# Patient Record
Sex: Female | Born: 1975 | Race: Black or African American | Hispanic: No | Marital: Married | Smoking: Current every day smoker
Health system: Southern US, Community
[De-identification: ages and names within clinical notes are randomized; demographics above are authoritative.]

## PROBLEM LIST (undated history)

## (undated) DIAGNOSIS — I10 Essential (primary) hypertension: Secondary | ICD-10-CM

## (undated) HISTORY — PX: OTHER SURGICAL HISTORY: SHX169

## (undated) HISTORY — PX: CHOLECYSTECTOMY: SHX55

---

## 2002-05-06 ENCOUNTER — Emergency Department (HOSPITAL_COMMUNITY): Admission: EM | Admit: 2002-05-06 | Discharge: 2002-05-06 | Payer: Self-pay

## 2002-05-09 ENCOUNTER — Emergency Department (HOSPITAL_COMMUNITY): Admission: EM | Admit: 2002-05-09 | Discharge: 2002-05-09 | Payer: Self-pay | Admitting: Emergency Medicine

## 2002-05-12 ENCOUNTER — Emergency Department (HOSPITAL_COMMUNITY): Admission: EM | Admit: 2002-05-12 | Discharge: 2002-05-12 | Payer: Self-pay | Admitting: Emergency Medicine

## 2002-07-23 ENCOUNTER — Emergency Department (HOSPITAL_COMMUNITY): Admission: EM | Admit: 2002-07-23 | Discharge: 2002-07-23 | Payer: Self-pay | Admitting: Emergency Medicine

## 2003-04-16 ENCOUNTER — Encounter: Payer: Self-pay | Admitting: Emergency Medicine

## 2003-04-16 ENCOUNTER — Emergency Department (HOSPITAL_COMMUNITY): Admission: EM | Admit: 2003-04-16 | Discharge: 2003-04-16 | Payer: Self-pay | Admitting: Emergency Medicine

## 2003-04-22 ENCOUNTER — Encounter: Payer: Self-pay | Admitting: Family Medicine

## 2003-04-22 ENCOUNTER — Inpatient Hospital Stay (HOSPITAL_COMMUNITY): Admission: AD | Admit: 2003-04-22 | Discharge: 2003-04-22 | Payer: Self-pay | Admitting: Obstetrics & Gynecology

## 2003-05-06 ENCOUNTER — Ambulatory Visit (HOSPITAL_COMMUNITY): Admission: RE | Admit: 2003-05-06 | Discharge: 2003-05-06 | Payer: Self-pay | Admitting: Family Medicine

## 2003-05-06 ENCOUNTER — Encounter: Payer: Self-pay | Admitting: Family Medicine

## 2003-12-29 ENCOUNTER — Encounter (INDEPENDENT_AMBULATORY_CARE_PROVIDER_SITE_OTHER): Payer: Self-pay | Admitting: Specialist

## 2003-12-29 ENCOUNTER — Inpatient Hospital Stay (HOSPITAL_COMMUNITY): Admission: AD | Admit: 2003-12-29 | Discharge: 2004-01-01 | Payer: Self-pay | Admitting: Obstetrics and Gynecology

## 2004-05-28 ENCOUNTER — Emergency Department (HOSPITAL_COMMUNITY): Admission: EM | Admit: 2004-05-28 | Discharge: 2004-05-28 | Payer: Self-pay | Admitting: Family Medicine

## 2004-05-30 ENCOUNTER — Emergency Department (HOSPITAL_COMMUNITY): Admission: EM | Admit: 2004-05-30 | Discharge: 2004-05-30 | Payer: Self-pay | Admitting: Emergency Medicine

## 2004-06-13 ENCOUNTER — Emergency Department (HOSPITAL_COMMUNITY): Admission: EM | Admit: 2004-06-13 | Discharge: 2004-06-13 | Payer: Self-pay | Admitting: Family Medicine

## 2004-09-12 ENCOUNTER — Emergency Department (HOSPITAL_COMMUNITY): Admission: EM | Admit: 2004-09-12 | Discharge: 2004-09-12 | Payer: Self-pay | Admitting: Family Medicine

## 2004-10-22 ENCOUNTER — Ambulatory Visit (HOSPITAL_BASED_OUTPATIENT_CLINIC_OR_DEPARTMENT_OTHER): Admission: RE | Admit: 2004-10-22 | Discharge: 2004-10-22 | Payer: Self-pay | Admitting: General Surgery

## 2004-10-22 ENCOUNTER — Encounter (INDEPENDENT_AMBULATORY_CARE_PROVIDER_SITE_OTHER): Payer: Self-pay | Admitting: Specialist

## 2004-10-22 ENCOUNTER — Ambulatory Visit (HOSPITAL_COMMUNITY): Admission: RE | Admit: 2004-10-22 | Discharge: 2004-10-22 | Payer: Self-pay | Admitting: General Surgery

## 2004-12-27 ENCOUNTER — Emergency Department (HOSPITAL_COMMUNITY): Admission: EM | Admit: 2004-12-27 | Discharge: 2004-12-27 | Payer: Self-pay | Admitting: Family Medicine

## 2005-03-21 ENCOUNTER — Emergency Department (HOSPITAL_COMMUNITY): Admission: EM | Admit: 2005-03-21 | Discharge: 2005-03-21 | Payer: Self-pay | Admitting: Emergency Medicine

## 2005-05-27 ENCOUNTER — Ambulatory Visit (HOSPITAL_COMMUNITY): Admission: RE | Admit: 2005-05-27 | Discharge: 2005-05-28 | Payer: Self-pay | Admitting: General Surgery

## 2005-05-27 ENCOUNTER — Encounter (INDEPENDENT_AMBULATORY_CARE_PROVIDER_SITE_OTHER): Payer: Self-pay | Admitting: *Deleted

## 2005-08-22 ENCOUNTER — Other Ambulatory Visit: Admission: RE | Admit: 2005-08-22 | Discharge: 2005-08-22 | Payer: Self-pay | Admitting: Obstetrics and Gynecology

## 2005-09-13 ENCOUNTER — Emergency Department (HOSPITAL_COMMUNITY): Admission: EM | Admit: 2005-09-13 | Discharge: 2005-09-13 | Payer: Self-pay | Admitting: Family Medicine

## 2005-09-15 ENCOUNTER — Emergency Department (HOSPITAL_COMMUNITY): Admission: AD | Admit: 2005-09-15 | Discharge: 2005-09-15 | Payer: Self-pay | Admitting: Emergency Medicine

## 2005-10-18 ENCOUNTER — Inpatient Hospital Stay (HOSPITAL_COMMUNITY): Admission: AD | Admit: 2005-10-18 | Discharge: 2005-10-18 | Payer: Self-pay | Admitting: Obstetrics and Gynecology

## 2006-01-14 ENCOUNTER — Encounter: Admission: RE | Admit: 2006-01-14 | Discharge: 2006-02-18 | Payer: Self-pay | Admitting: Obstetrics and Gynecology

## 2006-03-07 ENCOUNTER — Inpatient Hospital Stay (HOSPITAL_COMMUNITY): Admission: RE | Admit: 2006-03-07 | Discharge: 2006-03-10 | Payer: Self-pay | Admitting: Obstetrics and Gynecology

## 2006-03-07 ENCOUNTER — Encounter (INDEPENDENT_AMBULATORY_CARE_PROVIDER_SITE_OTHER): Payer: Self-pay | Admitting: Specialist

## 2006-06-06 ENCOUNTER — Emergency Department (HOSPITAL_COMMUNITY): Admission: EM | Admit: 2006-06-06 | Discharge: 2006-06-06 | Payer: Self-pay | Admitting: Family Medicine

## 2006-06-07 ENCOUNTER — Emergency Department (HOSPITAL_COMMUNITY): Admission: EM | Admit: 2006-06-07 | Discharge: 2006-06-07 | Payer: Self-pay | Admitting: Family Medicine

## 2006-08-10 ENCOUNTER — Emergency Department (HOSPITAL_COMMUNITY): Admission: EM | Admit: 2006-08-10 | Discharge: 2006-08-10 | Payer: Self-pay | Admitting: Family Medicine

## 2006-08-15 ENCOUNTER — Emergency Department (HOSPITAL_COMMUNITY): Admission: EM | Admit: 2006-08-15 | Discharge: 2006-08-16 | Payer: Self-pay | Admitting: Emergency Medicine

## 2007-01-11 ENCOUNTER — Emergency Department (HOSPITAL_COMMUNITY): Admission: EM | Admit: 2007-01-11 | Discharge: 2007-01-11 | Payer: Self-pay | Admitting: Emergency Medicine

## 2007-07-17 ENCOUNTER — Emergency Department (HOSPITAL_COMMUNITY): Admission: EM | Admit: 2007-07-17 | Discharge: 2007-07-17 | Payer: Self-pay | Admitting: Family Medicine

## 2007-08-20 ENCOUNTER — Emergency Department (HOSPITAL_COMMUNITY): Admission: EM | Admit: 2007-08-20 | Discharge: 2007-08-20 | Payer: Self-pay | Admitting: Emergency Medicine

## 2008-01-17 ENCOUNTER — Emergency Department (HOSPITAL_COMMUNITY): Admission: EM | Admit: 2008-01-17 | Discharge: 2008-01-17 | Payer: Self-pay | Admitting: Family Medicine

## 2008-02-12 ENCOUNTER — Emergency Department (HOSPITAL_COMMUNITY): Admission: EM | Admit: 2008-02-12 | Discharge: 2008-02-12 | Payer: Self-pay | Admitting: Neurosurgery

## 2008-08-02 ENCOUNTER — Emergency Department (HOSPITAL_COMMUNITY): Admission: EM | Admit: 2008-08-02 | Discharge: 2008-08-02 | Payer: Self-pay | Admitting: Family Medicine

## 2009-01-06 ENCOUNTER — Emergency Department (HOSPITAL_COMMUNITY): Admission: EM | Admit: 2009-01-06 | Discharge: 2009-01-06 | Payer: Self-pay | Admitting: Family Medicine

## 2009-01-11 ENCOUNTER — Emergency Department (HOSPITAL_COMMUNITY): Admission: EM | Admit: 2009-01-11 | Discharge: 2009-01-11 | Payer: Self-pay | Admitting: Emergency Medicine

## 2010-01-04 ENCOUNTER — Emergency Department (HOSPITAL_COMMUNITY): Admission: EM | Admit: 2010-01-04 | Discharge: 2010-01-04 | Payer: Self-pay | Admitting: Family Medicine

## 2010-04-16 ENCOUNTER — Emergency Department (HOSPITAL_COMMUNITY): Admission: EM | Admit: 2010-04-16 | Discharge: 2010-04-16 | Payer: Self-pay | Admitting: Pediatrics

## 2010-09-30 ENCOUNTER — Encounter: Payer: Self-pay | Admitting: Internal Medicine

## 2010-11-06 ENCOUNTER — Emergency Department (HOSPITAL_COMMUNITY)
Admission: EM | Admit: 2010-11-06 | Discharge: 2010-11-06 | Disposition: A | Discharge status: Home / Self Care | Payer: Self-pay | Attending: Emergency Medicine | Admitting: Emergency Medicine

## 2010-11-06 DIAGNOSIS — L732 Hidradenitis suppurativa: Secondary | ICD-10-CM | POA: Insufficient documentation

## 2010-11-06 DIAGNOSIS — IMO0002 Reserved for concepts with insufficient information to code with codable children: Secondary | ICD-10-CM | POA: Insufficient documentation

## 2010-11-23 LAB — CULTURE, ROUTINE-ABSCESS

## 2010-11-27 LAB — POCT URINALYSIS DIP (DEVICE)
Protein, ur: 100 mg/dL — AB
Specific Gravity, Urine: 1.025 (ref 1.005–1.030)
Urobilinogen, UA: 0.2 mg/dL (ref 0.0–1.0)
pH: 5.5 (ref 5.0–8.0)

## 2010-11-27 LAB — POCT I-STAT, CHEM 8
BUN: 7 mg/dL (ref 6–23)
Calcium, Ion: 1.15 mmol/L (ref 1.12–1.32)
Chloride: 108 mEq/L (ref 96–112)
Creatinine, Ser: 0.4 mg/dL (ref 0.4–1.2)
Glucose, Bld: 98 mg/dL (ref 70–99)
TCO2: 23 mmol/L (ref 0–100)

## 2010-11-27 LAB — POCT PREGNANCY, URINE: Preg Test, Ur: NEGATIVE

## 2010-12-19 LAB — POCT RAPID STREP A (OFFICE): Streptococcus, Group A Screen (Direct): NEGATIVE

## 2011-01-03 ENCOUNTER — Inpatient Hospital Stay (INDEPENDENT_AMBULATORY_CARE_PROVIDER_SITE_OTHER)
Admission: RE | Admit: 2011-01-03 | Discharge: 2011-01-03 | Disposition: A | Discharge status: Home / Self Care | Payer: Self-pay | Source: Ambulatory Visit | Attending: Family Medicine | Admitting: Family Medicine

## 2011-01-03 DIAGNOSIS — F411 Generalized anxiety disorder: Secondary | ICD-10-CM

## 2011-01-03 DIAGNOSIS — I10 Essential (primary) hypertension: Secondary | ICD-10-CM

## 2011-01-03 LAB — POCT I-STAT, CHEM 8
BUN: 7 mg/dL (ref 6–23)
Calcium, Ion: 1.19 mmol/L (ref 1.12–1.32)
Chloride: 104 mEq/L (ref 96–112)
Creatinine, Ser: 0.8 mg/dL (ref 0.4–1.2)
Glucose, Bld: 92 mg/dL (ref 70–99)
TCO2: 27 mmol/L (ref 0–100)

## 2011-01-25 NOTE — H&P (Signed)
NAME:  Mindy [name], Mindy [name] NO.:  [id number]   MEDICAL RECORD NO.:  [id number]          PATIENT TYPE:  INP   LOCATION:  NA                            FACILITY:  [facility]   PHYSICIAN:  Osborn Coho, M.D.   DATE OF BIRTH:  03-May-1976   DATE OF ADMISSION:  DATE OF DISCHARGE:                                HISTORY & PHYSICAL   This is a 35 year old gravida 5, para 2-0-   Dictation ended at this point.      Marie L. Williams, C.N.M.      Osborn Coho, M.D.  Electronically Signed    MLW/MEDQ  D:  03/07/2006  T:  03/07/2006  Job:  [id number]

## 2011-01-25 NOTE — Op Note (Signed)
Mindy [name], [name]:  [id number]   MEDICAL RECORD NO.:  [id number]          PATIENT TYPE:  OIB   LOCATION:  5731                         FACILITY:  [facility]   PHYSICIAN:  Cherylynn Ridges, M.D.    DATE OF BIRTH:  1976-08-21   DATE OF PROCEDURE:  05/27/2005  DATE OF DISCHARGE:                                 OPERATIVE REPORT   PREOPERATIVE DIAGNOSIS:  Symptomatic cholelithiasis with normal liver  function tests.   POSTOPERATIVE DIAGNOSIS:  Symptomatic cholelithiasis with normal liver  function tests.   PROCEDURE:  Laparoscopic cholecystectomy with cholangiogram.   SURGEON:  Cherylynn Ridges, M.D.   ASSISTANT:  Anselm Pancoast. Zachery Dakins, M.D.   ANESTHESIA:  General endotracheal.   ESTIMATED BLOOD LOSS:  Less than 20 mL.   COMPLICATIONS:  None.   CONDITION:  Stable.   SPECIMEN:  Gallbladder plus stones.   INDICATIONS FOR OPERATION:  The patient is a 35 year old with abdominal pain  in her right upper quadrant, who now comes in for an elective laparoscopic  cholecystectomy.   FINDINGS:  The patient had adhesions to the dome of the gallbladder  indicative of previous inflammation.  She also had a bilobed sort of  gallbladder which folded onto itself into the gallbladder bed.  Pictures of  this were taken.   OPERATION:  The patient was taken to the operating room and placed on the  table in supine position.  After an adequate endotracheal anesthetic was  administered, she was prepped and draped in the usual sterile manner,  exposing the midline and the right upper quadrant.   A supraumbilical curvilinear incision was made using a #11 blade and taken  down to the midline fascia.  It was through this midline fascia that a  Veress needle was passed into the peritoneal cavity and confirmed to be in  position using the saline test.  Once it was confirmed to be in good  position, carbon dioxide gas was insufflated through the Veress needle into  the peritoneal  cavity up to maximal intra-abdominal pressure of 50 mmHg.   Once this was completed, 2 right costal margin 5-mm cannulas and a  subxiphoid 11/12-mm cannula were passed under direct vision into the  peritoneal cavity.  The patient was placed in a steep reversed  Trendelenburg, the left side was tilted down and we began the dissection of  the gallbladder.   As mentioned previously, there were adhesions of omentum and also the  duodenum to the body and infundibulum of the gallbladder.  These were  carefully taken away using blunt and sharp dissection with electrocautery.  We were able to expose the infundibulum and subsequently the peritoneum  overlying the triangle of Calot and hepatoduodenal triangle.  We dissected  out windows around both cystic duct and the cystic artery.  The cystic  artery was clipped doubly on its distal side and proximally along the  gallbladder side x1 and transected.  We subsequently clipped the cystic duct  along the gallbladder side and performed a choledochotomy through which a  Cook catheter was passed which had been passed through  the anterior  abdominal wall.  The cholangiogram was done, which showed good flow into the  duodenum, no intraductal filling defects, no biliary dilatation and good  proximal flow.  There was also good flow into the duodenum.  Once this was  completed, we removed the [facility] catheter after removing its clip, which had  secured it in place, endoclipped the cystic duct distally x2 and then  transected the cystic duct.  We then dissected out the gallbladder from its  bed with minimal difficulty , although it was bilobed and showed possibility  of having 2 different gallbladders; it was just folded on itself.  All  needle counts and sponge counts were correct.  Once we had the gallbladder  dissected out, we passed it into an EndoCatch bag and brought it out through  the supraumbilical fascia.  We closed the fascia using a figure-of-eight   stitch of 0 Vicryl.  We injected Marcaine 0.25% with epinephrine at all  sites and once all gas and instrument had been removed, we closed fascia and  closed the skin using a running subcuticular stitch of 4-0 Vicryl.  Sterile  dressings were applied.  All needle counts, sponge counts and instrument  counts were correct.      Cherylynn Ridges, M.D.  Electronically Signed     JOW/MEDQ  D:  05/27/2005  T:  05/28/2005  Job:  [id number]   cc:   Dr. Cindee Lame

## 2011-01-25 NOTE — Op Note (Signed)
NAMEJANNELLY, BERGREN NO.:  [id number]   MEDICAL RECORD NO.:  [id number]                   PATIENT TYPE:  INP   LOCATION:  9101                                 [facility]ACILITY[facility]  [facility]   PHYSICIAN:  Osborn Coho, M.D.                DATE OF BIRTH:  [birth year]   DATE OF PROCEDURE:  12/29/2003  DATE OF DISCHARGE:                                 OPERATIVE REPORT   PREOPERATIVE DIAGNOSIS:  1. Term intrauterine pregnancy.  2. IUGR.  3. Oligohydramnios.  4. Repeat cesarean section.   POSTOPERATIVE DIAGNOSIS:  1. Term intrauterine pregnancy.  2. IUGR.  3. Oligohydramnios.  4. Repeat cesarean section.   PROCEDURE:  Repeat low transverse cesarean section via Pfannenstiel skin  incision.   ANESTHESIA:  Spinal.   ATTENDING PHYSICIAN:  Osborn Coho, M.D.   FLUIDS REPLACED:  2000 mL.   URINE OUTPUT:  125 mL.   ESTIMATED BLOOD LOSS:  400 mL.   FINDINGS:  Live female infant with Apgars of 9 at one minute and 9 at five  minutes.  Normal to palpation bilateral ovaries and tubes.   COMPLICATIONS:  None.   PATHOLOGY:  Placenta.   DESCRIPTION OF PROCEDURE:  The patient was taken to the operating room after  the risks, benefits and alternatives were discussed with the patient, the  patient verbalized understanding and consent signed and witness.  The  patient was given a spinal per anesthesia and prepped and draped in the  normal sterile fashion.  A Pfannenstiel skin incision was made at the site  of the prior scar and carried down to the underlying layer of fascia with  the knife and Bovie.  The fascia was excised bilaterally in the midline with  the Bovie and extended bilaterally with the Mayo scissors.  Kocher clamps  were placed on the inferior aspect of the fascial incision and the rectus  muscle excised from the fascia with the scissors and Bovie.  The same was  done on the superior aspect of the fascial incision.  The muscle was  separated in the  midline and scarring noted of the bladder to the peritoneum  to the lower portion of the rectus muscle.  Adhesions were removed and the  peritoneum was entered bluntly and peritoneum retracted manually.  The  bladder blade was placed and bladder flap created with Metzenbaum scissors.  The uterine incision was made and extended bilaterally with the bandage  scissors.  No fluid return after rupture of membranes and thick meconium  noted.  The infant was in the vertex presentation and upon delivery of the  head, the oropharynx and nasopharynx were DeLee suctioned.  The remainder of  the infant was delivered and handed off to the awaiting pediatricians after  the cord was clamped and cut.  Cord bloods were collected.  The placenta was  sent to pathology.  The uterus was cleared of all clots  and debris after the  placenta was removed via fundal massage.  A sponge stick was used to dilate  the cervix.  The uterine incision  was repaired with 0 Vicryl in a running  locked fashion.  A second imbricating layer was performed.  The bilateral  ovaries and fallopian tubes felt normal to palpation.  The intra-abdominal  cavity was copiously irrigated and the gutters were cleared of all clots and  debris.  The peritoneum was closed with 3-0 chromic.  The fascia was  repaired with 0 Vicryl in a running fashion.  The subcutaneous tissue was  irrigated and made hemostatic with the Bovie and a subcuticular stitch was  placed to reapproximate the skin using 3-0 Monocryl.  Sponge, lap and needle  counts were correct.  The patient tolerated the procedure well and was  returned to the recovery room in stable condition.                                               Osborn Coho, M.D.    AR/MEDQ  D:  12/29/2003  T:  12/30/2003  Job:  [id number]

## 2011-01-25 NOTE — Discharge Summary (Signed)
NAMECAYLAH, Mindy [name]                          ACCOUNT NO.:  [id number]   MEDICAL RECORD NO.:  [id number]                   PATIENT TYPE:  INP   LOCATION:  9101                                 [facility]ACILITY[facility]   PHYSICIAN:  Hal Morales, M.D.             DATE OF BIRTH:  1976-03-07   DATE OF ADMISSION:  12/29/2003  DATE OF DISCHARGE:  01/01/2004                                 DISCHARGE SUMMARY   ADMISSION DIAGNOSES:  1. Intrauterine pregnancy at 41-2/7 weeks post dates.  2. Intrauterine growth restriction.  3. Oligohydramnios.  4. Previous Cesarean section.   PROCEDURE:  Repeat Cesarean section with Pfannenstiel.   DISCHARGE DIAGNOSES:  1. Intrauterine pregnancy at 41-2/7 weeks post dates.  2. Intrauterine growth restriction.  3. Oligohydramnios.  4. Previous Cesarean section.   [name] is a 35 year old, gravida 4, para 1-0-2-1, who presents at 41-2/7  weeks from the office following ultrasound showing IUGR and oligohydramnios.  She had a previous Cesarean section and options were discussed with her by  Dr. Osborn Coho regarding induction of labor with the use of Pitocin and  repeat C-section. The risks and benefits of both and the risks and benefits  of VBAC were discussed with the patient and decision was made for repeat  Cesarean section that was performed by Dr. Osborn Coho on December 29, 2003,  with the birth of a 6 pound 6 ounce female infant with Apgar scores of 9 at  1 minute and 9 at 5 minutes. The patient and baby have both done well in the  postoperative period. The patient's vital signs have all remained stable on  the first postoperative day. Her hemoglobin was 10.8.  Her incision remained  clean, dry, and intact. On the third postoperative day she was judged to be  in satisfactory condition for discharge.   DISCHARGE INSTRUCTIONS:  Per [facility] handout.   DISCHARGE MEDICATIONS:  Motrin 600 mg p.o. q.6h. p.r.n. pain, Tylox one to  two p.o.  q.3-4h. p.r.n. pain, Micronor for birth control, and prenatal  vitamins.   The patient will follow up at the office of [facility] in six weeks.     Rica Koyanagi, C.N.M.               Hal Morales, M.D.    SDM/MEDQ  D:  01/01/2004  T:  01/01/2004  Job:  [id number]
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
## 2011-01-25 NOTE — Discharge Summary (Signed)
Mindy [name], CARTAYA NO.:  [id number]   MEDICAL RECORD NO.:  [id number]          PATIENT TYPE:  INP   LOCATION:  9120                          FACILITY:  [facility]   PHYSICIAN:  Osborn Coho, M.D.   DATE OF BIRTH:  [birth year]   DATE OF ADMISSION:  03/07/2006  DATE OF DISCHARGE:  03/10/2006                                 DISCHARGE SUMMARY   ADMITTING DIAGNOSES:  1.  Intrauterine pregnancy at term.  2.  Elective repeat cesarean section.  3.  Desires permanent sterilization.   DISCHARGE DIAGNOSES:  1.  Intrauterine pregnancy at term.  2.  Elective repeat cesarean section.  3.  Desires permanent sterilization.   PROCEDURES:  1.  Repeat low-transverse cesarean section.  2.  Bilateral tubal ligation.  3.  Spinal anesthesia.   HOSPITAL COURSE:  Ms. Esquer is a 35 year old, gravida 5, para 2-0-2-2 who  presented at 29 and 3/7 weeks for a repeat elective cesarean section.  Her  history had been remarkable for:  1.  Previous C-section x2 with a desire for repeat.  2.  Desires tubal sterilization.  3.  Unsure dates.  4.  First trimester bleeding.  5.  Sickle cell trait.  6.  History of depression.  7.  History of oligohydramnios with previous pregnancy.  8.  Smoker.  9.  Latex sensitivity.  10. Gestational diabetes on insulin.  11. Polyhydramnios.   The patient was taken to the operating room on March 07, 2006 where a repeat  low-transfer cesarean section was performed by Dr. Osborn Coho, along  with a tubal sterilization under spinal anesthesia.  Findings were a viable  female, weight 6 pounds 7 ounces, Apgars were 9 and 9.  Infant was taken to  the full-term nursery and mother was taken to recovery in good condition.  By postop day 1, the patient was doing well.  She was up ad lib and she was  tolerating a regular diet.  She was breastfeeding.  Her hemoglobin was 9.9,  down from 11.6.  Platelets were 197.  Her white blood cell count was 80.  Her fasting blood  sugar on the morning of postop day 1 was 94.  The rest of  her hospital stay was essentially uncomplicated.  By postop day 3, she was  again continuing to do well.  Vital signs were stable.  Her incision was  clean, dry and intact with subcuticular sutures noted.  She was deemed to  have received the full benefit of her hospital stay and was discharged home.   DISCHARGE INSTRUCTIONS:  Per [facility] handout.   DISCHARGE MEDICATIONS:  1.  Motrin 600 mg p.o. q.6h. p.r.n. pain.  2.  Tylox 1-2 p.o. q.3-4h. p.r.n. pain.   DISCHARGE FOLLOWUP:  Will occur in 6 weeks at [facility].      Renaldo Reel Emilee Hero, C.N.M.      Osborn Coho, M.D.  Electronically Signed    VLL/MEDQ  D:  03/10/2006  T:  03/10/2006  Job:  [id number]

## 2011-01-25 NOTE — H&P (Signed)
NAME:  Mindy [name], Mindy [name] NO.:  [id number]   MEDICAL RECORD NO.:  [id number]                   PATIENT TYPE:  MAT   LOCATION:  MATC                                 [facility]   PHYSICIAN:  Osborn Coho, M.D.                DATE OF BIRTH:  06/27/1976   DATE OF ADMISSION:  12/29/2003  DATE OF DISCHARGE:                                HISTORY & PHYSICAL   HISTORY OF PRESENT ILLNESS:  This is a 35 year old gravida 4 para 1-0-2-1 at  21 and two-sevenths weeks who presents from the office with reported IUGR  and oligohydramnios at post term.  She is to be delivered by either a trial  of labor or repeat C-section - to be decided.  She reports mild uterine  contractions, some wetness last night, and positive fetal movement.  She  denies bleeding.  The pregnancy has been followed by the M.D. service and  remarkable for:  1. Unsure dates.  2. Previous C-section.  3. Sickle cell trait.  4. History of depression.  5. Group B strep positive.   PRENATAL LABORATORY DATA:  Blood type is A positive.  Other labs are  currently unavailable on the record.   HISTORY OF CURRENT PREGNANCY:  The patient entered care at [redacted] weeks  gestation.  She had some problems with an abscessed tooth during her  pregnancy.  She also experienced some weight loss due to poor eating habits.  She also had some low back pain which was worked up as preterm labor and the  workup was negative.  She was ambivalent back and forth during the pregnancy  about planning a trial of labor.  At the end of her pregnancy she  experienced a left axillary abscess which was incised and drained on December 14, 2003 with no complications.  She presented to the office today and was  found to have oligohydramnios and IUGR and was therefore sent over to the  hospital for delivery.   OBSTETRICAL HISTORY:  Remarkable for a spontaneous abortion in 1998 at [redacted]  weeks gestation with a D&C.  She had a low transverse  cesarean section in  2000 of a female infant at [redacted] weeks gestation weighing 8 pounds 4 ounces,  remarkable for a nonreassuring fetal heart rate tracing and postoperative  wound dehiscence.  She had an elective abortion in 2001 at [redacted] weeks  gestation with no complications.   MEDICAL HISTORY:  1. Anemia in her second pregnancy.  2. Conception on oral contraceptives this pregnancy.  3. She has a history of abnormal Pap smear in 1996 with normal ever since.  4. History of chlamydia and gonorrhea in 1998 and 2002 respectively.  5. History of Trichomonas in 2002.  6. Childhood varicella.  7. History of depression for which she currently does not take any     medications.  8. History of sickle cell trait.  SURGICAL HISTORY:  Remarkable for a C-section in 2000, a D&C in 1998, and  elective abortion in 2001.   FAMILY HISTORY:  Remarkable for grandmother with heart disease and  hypertension.  Brother with asthma.  A daughter with asthma.  Grandmother  and uncle with diabetes.  Uncle with migraines.  An aunt with breast cancer.   GENETIC HISTORY:  Remarkable for the patient with sickle cell trait.  First  cousin with sickle cell disease.  Maternal relatives with sickle cell trait.  She also has an uncle who has two sets of twins.   SOCIAL HISTORY:  The patient is single but involved with [employer] who  is somewhat involved but present today.  She works as a Magazine features editor.  She is of the [place] faith.  She  denies any alcohol, tobacco, or drug use.   ALLERGIES:  Remarkable for MORPHINE which makes her itch.   CURRENT MEDICATIONS:  Prenatal vitamins and recent antibiotic use.   OBJECTIVE DATA:  VITAL SIGNS:  Stable, afebrile.  HEENT:  Within normal limits.  Thyroid normal, not enlarged.  CHEST:  Clear to auscultation.  HEART:  Regular rate and rhythm.  ABDOMEN:  Gravid, vertex to Leopold's.  EFM shows a reactive fetal heart  rate tracing with  uterine contractions every 8 minutes which are mild.  PELVIC:  Sterile exam shows negative pooling, negative nitrazine, negative  ferning.  Cervix is closed, 50%, -2, and vertex presentation.  EXTREMITIES:  Within normal limits.   ASSESSMENT:  1. Intrauterine pregnancy at 41 and two-sevenths weeks.  2. Post dates.  3. Intrauterine growth restriction.  4. Oligohydramnios.  5. Reassuring fetal heart rate tracing currently.   PLAN:  1. Admit to birthing suites per Dr. Stefano Gaul.  2. Dr. Su Hilt will decide mode of delivery and further orders to follow.     Marie L. Williams, C.N.M.                 Osborn Coho, M.D.    MLW/MEDQ  D:  12/29/2003  T:  12/29/2003  Job:  [id number]

## 2011-01-25 NOTE — Op Note (Signed)
[name], [name] NO.:  [id number]   MEDICAL RECORD NO.:  [id number]          PATIENT TYPE:  INP   LOCATION:  9120                          FACILITY:  [facility]   PHYSICIAN:  Osborn Coho, M.D.   DATE OF BIRTH:  1975/10/06   DATE OF PROCEDURE:  03/07/2006  DATE OF DISCHARGE:                                 OPERATIVE REPORT   PREOPERATIVE DIAGNOSES:  1.  Term intrauterine pregnancy.  2.  Elective repeat cesarean section.  3.  Desires permanent sterilization.   POSTOPERATIVE DIAGNOSES:  1.  Term intrauterine pregnancy.  2.  Elective repeat cesarean section.  3.  Desires permanent sterilization.   PROCEDURE:  Repeat C-section and bilateral tubal ligation.   ATTENDING:  Osborn Coho, M.D.   ASSISTANT:  Wynelle Bourgeois, C.N.M.   ANESTHESIA:  Spinal.   SPECIMENS TO PATHOLOGY:  Placenta.   FLUID:  3200 cubic centimeters.   ESTIMATED BLOOD LOSS:  700 cubic centimeters.   URINE OUTPUT:  125 cubic centimeters.   COMPLICATIONS:  None.   DESCRIPTION OF PROCEDURE:  The patient was taken to the operating room after  the risks, benefits, and alternatives reviewed with the patient.  The  patient verbalized understanding and consent signed and witnessed.  The  patient was given a spinal per anesthesia and prepped and draped in the  normal sterile fashion.  A Pfannenstiel skin incision was made at the site  of the prior scar and carried down to the underlying layer of fascia with  the scalpel and Bovie.  The fascia was excised bilaterally in the midline  with the Bovie and the incision was extended bilaterally with the Mayo  scissors.  Kocher clamps were placed on the inferior aspect of the fascial  incision and the rectus muscle excised from the fascia.  The same was done  on the superior aspect of fascial incision.  There were dense adhesions in  the midline, which were taken down with the Bovie and Mayo scissors.  The  peritoneum was entered and extended with  the Metzenbaum scissors and  manually.  The bladder blade was placed and the bladder flap was noted to be  densely adherent and was only taken down a small portion until decision was  made to excise inside the uterus above the incision.  The uterine incision  was then made and extended bilaterally with the bandage scissors.  The  infant was delivered in the vertex presentation without difficulty, and the  oral and nasopharynx were bulb suctioned.  The cord was clamped and cut and  the infant handed to the awaiting pediatricians.  Antibiotics were  administered and the cord was delivered via fundal massage and handed off to  be sent to pathology.  The uterus was cleared of all clots and debris.  The  uterine incision was repaired with 0-Vicryl in a running locked fashion and  a second imbricating layer was performed.  The attention was then turned to  the left adnexa and normal-appearing ovary noted, however was surrounded by  adhesions as well.  The Tanja Port was placed on the fallopian tube  in the  isthmic portion after carrying it to its fimbriated end and was ligated  using 0-plain ties.  The fallopian tube was then excised and the portion of  fallopian tube sent to pathology.  The remaining pedicle was cauterized with  the Bovie.  Attention was then turned to the right fallopian tube which was  grasped in the isthmic portion as well after carrying out to its fimbriated  end.  It was also ligated using 0-plain ties and excised.  The remaining  pedicle was cauterized and the portion of fallopian tube sent to pathology.  There was noted to be bleeding, however, of the right fallopian tube and  there was dilated venous plexus within the mesosalpinx.  A Kelly clamp was  then placed on the fallopian tube, the edges of the fallopian tube, and  incorporated the area of the mesosalpinx which was bleeding.  This was then  tied with 0-Vicryl and suture ligated using 0-Vicryl as well.  Good   hemostasis was noted and the extra portion of tube was excised and also sent  to pathology.  The intra-abdominal cavity was copiously irrigated and the  peritoneum repaired with 2-0 chromic.  The fascia was repaired with 0-Vicryl  in a running fashion, and the subcutaneous tissue was irrigated and made  hemostatic with the Bovie.  The subcutaneous tissue was reapproximated using  2-0 plain via three interrupted stitches, and the skin was reapproximated  using 3-0 Monocryl via a subcuticular stitch.  Sponge, lap, and needle count  was correct.  The patient tolerated the procedure well and was returned to  the recovery room in good condition.      Osborn Coho, M.D.  Electronically Signed     AR/MEDQ  D:  03/10/2006  T:  03/10/2006  Job:  [id number]

## 2011-01-25 NOTE — Op Note (Signed)
[name], Mindy [name] NO.:  [id number]   MEDICAL RECORD NO.:  [id number]          PATIENT TYPE:  AMB   LOCATION:  DSC                          FACILITY:  [facility]   PHYSICIAN:  Cherylynn Ridges III, M.D.DATE OF BIRTH:  06-09-1976   DATE OF PROCEDURE:  10/22/2004  DATE OF DISCHARGE:                                 OPERATIVE REPORT   PREOPERATIVE DIAGNOSIS:  Hidradenitis, suppurative of right axilla.   POSTOPERATIVE DIAGNOSIS:  Hidradenitis, suppurative of right axilla.   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   SURGEON:  Dr. Lindie Spruce.   ASSISTANT:  None.   ANESTHESIA:  General with a laryngeal airway.   ESTIMATED BLOOD LOSS:  Less than 30 mL.   COMPLICATIONS:  None.   CONDITION:  Stable.   FINDINGS:  The patient had a moderate area of hidradenitis, extending from  the anterior axilla to the posterior axilla approximately 13 cm in total  length with area of about 6 cm.  This completely excised down into the  subcutaneous tissue, and no infected pockets of pus were noted.   INDICATIONS FOR OPERATION:  The patient is a 35 year old with symptomatic  hidradenitis who comes in for excision.   OPERATION:  The patient was taken to the operating room, placed on table in  supine position.  After an adequate general laryngeal airway anesthetic was  administered, she was prepped and draped in the usual sterile manner and  exposing the right axilla.   We did shave her hairs prior to noting the areas of induration and previous  eruption of subcutaneous pus.  We marked this area then used a 15 blade to  excise an oval piece of tissue around the chronically-scarred and indurated  area of her right axilla.  We took it down into the subcu and obtained  hemostasis with electrocautery.  As noted, at the initial old incision, that  there were two other areas of hidradenitis with some clogged glands which  could be seen intraoperatively.  One was  more anterior, and one was more  posterior.  Both of these areas were excised and once this was done and  adequate hemostasis was obtained, we closed in two layers.  A deep subcu  layer of 3-0 Vicryl was  passed, making sure not to get down into the axillary vessels or nerves.  Then the skin was closed using interrupted simple stitches of 3-0 Prolene.  Approximately 20 such sutures were placed.  A sterile dressing was applied  including antibiotic ointment.  There was a slight dog ear anteriorly which  can be rectified in the future if necessary.      JOW/MEDQ  D:  10/22/2004  T:  10/22/2004  Job:  [id number]

## 2011-01-25 NOTE — Discharge Summary (Signed)
[name], [name]              ACCOUNT NO.:  [id number]   MEDICAL RECORD NO.:  [id number]          PATIENT TYPE:  MAT   LOCATION:  MATC                          [facility]ACILITY:  [facility]   PHYSICIAN:  Naima A. Dillard, M.D. DATE OF BIRTH:  10/05/1975   DATE OF ADMISSION:  10/18/2005  DATE OF DISCHARGE:                                 DISCHARGE SUMMARY   MATERNITY ADMISSIONS VISIT   Mindy [name] is a 35 year old gravida 5, para 2-0-2-2 at 16 and 4/7 weeks, who  presented after calling the office of [facility] with persistent  headache despite Midrin.  She had a previous evaluation at [facility] for this issue and was diagnosed with refractory mixed migraine  and tension headaches.  She did call that office today, but was told that  her physician was out of the office and was directed to call [facility] for care.  The patient denied any vomiting, diarrhea or other  symptoms.  She did have decreased appetite with the headache and has not had  much fluid intake since the headache began.  She was placed on Midrin over  the last several days and she took the full complement of this yesterday,  but had not had any relief.  She denies any visual symptoms or epigastric  pain.  The history was also remarkable for:  1.  Previous cesarean section x2.  2.  Abscess on right back of neck, which is healing well and had lancing      yesterday in [facility] ER.  3.  Morphine sensitivity with itching as a reaction.  4.  Sickle cell trait.  5.  Latex sensitivity with itching.  6.  History of depression.  7.  Migraines.  8.  Smoker of 5 cigarettes per day.   OBJECTIVE:  VITAL SIGNS:  Temp was 98.4, pulse was 105, respirations 20,  blood pressure was 135/78.  HEENT:  Pain was initially 8/10 localized in the frontotemporal areas  bilaterally.  Her pupils were equal and reactedto light well.  She had a  healing abscess on her back of neck on the right side.  CHEST:  Clear.  HEART:  Regular rate and rhythm without murmur.  Fetal heart rate was 160.  ABDOMEN:  Soft and nontender.  Fundal height was approximately 18 weeks'  size.  PELVIC EXAM:  Deferred.   Bedside ultrasound showed positive fetal movement, negative CVA tenderness  was noted.  A clean-catch urine was done, which showed a specific gravity of  1.020 and greater than 80 ketones.   While the patient was in maternity admissions, she received 2 Fioricet with  minimal benefit initially.  She then, after we discovered she was  dehydrated, received 1 bag of IV fluids with 1 mg of Stadol and 25 mg of  Phenergan.  This did seem to bring the headache down to a very minimal level  and she was able to tolerate fluids and a sandwich without any difficulty.  She reports after receiving the IV fluid, Stadol and Phenergan, that her  headache was down at a 0  to a 1.  She, therefore, was subsequently  discharged home after consult with Dr. Jaymes Graff as the on call  physician.   DISCHARGE INSTRUCTIONS:  The patient is to increase her fluid intake and to  rest as much as possible.   DISCHARGE MEDICATIONS:  Fioricet 1 to 2 p.o. q.4-6h. p.r.n. headache,  dispensed #30 with no refills.  This was called to a [facility].   DISCHARGE FOLLOWUP:  The patient will follow up with [facility] as  scheduled.  She will also call [facility] on Monday to set up a  followup appointment for reevaluation of the treatment plan of care for her  headaches.      Renaldo Reel Emilee Hero, C.N.M.      Naima A. Normand Sloop, M.D.  Electronically Signed    VLL/MEDQ  D:  10/18/2005  T:  10/19/2005  Job:  [id number]   cc:   Pramod P. Pearlean Brownie, MD  Fax: [phone]

## 2011-09-16 ENCOUNTER — Emergency Department (HOSPITAL_COMMUNITY)
Admission: EM | Admit: 2011-09-16 | Discharge: 2011-09-16 | Disposition: A | Discharge status: Home / Self Care | Payer: Self-pay | Attending: Emergency Medicine | Admitting: Emergency Medicine

## 2011-09-16 ENCOUNTER — Encounter: Payer: Self-pay | Admitting: Emergency Medicine

## 2011-09-16 DIAGNOSIS — I10 Essential (primary) hypertension: Secondary | ICD-10-CM | POA: Insufficient documentation

## 2011-09-16 DIAGNOSIS — M79609 Pain in unspecified limb: Secondary | ICD-10-CM | POA: Insufficient documentation

## 2011-09-16 DIAGNOSIS — F17200 Nicotine dependence, unspecified, uncomplicated: Secondary | ICD-10-CM | POA: Insufficient documentation

## 2011-09-16 DIAGNOSIS — IMO0002 Reserved for concepts with insufficient information to code with codable children: Secondary | ICD-10-CM | POA: Insufficient documentation

## 2011-09-16 DIAGNOSIS — L02412 Cutaneous abscess of left axilla: Secondary | ICD-10-CM

## 2011-09-16 HISTORY — DX: Essential (primary) hypertension: I10

## 2011-09-16 MED ORDER — OXYCODONE-ACETAMINOPHEN 5-325 MG PO TABS
1.0000 | ORAL_TABLET | Freq: Once | ORAL | Status: AC
  Administered 2011-09-16: 1 via ORAL
  Filled 2011-09-16: qty 1

## 2011-09-16 MED ORDER — OXYCODONE-ACETAMINOPHEN 5-325 MG PO TABS: 2.0000 | ORAL_TABLET | ORAL | Status: AC | PRN

## 2011-09-16 NOTE — ED Provider Notes (Signed)
History     CSN: [id number]  Arrival date & time 09/16/11  1240   First MD Initiated Contact with Patient 09/16/11 [phone]      Chief Complaint  Patient presents with  . Abscess    (Consider location/radiation/quality/duration/timing/severity/associated sxs/prior treatment) HPI History provided by pt.   Pt developed an abscess of left axilla approx 5 days ago.  Has gradually increased in size and become more painful.  No drainage despite warm compresses.  No associated fever.  Has had several in the past.    Past Medical History  Diagnosis Date  . Hypertension     History reviewed. No pertinent past surgical history.  History reviewed. No pertinent family history.  History  Substance Use Topics  . Smoking status: Current Everyday Smoker  . Smokeless tobacco: Not on file  . Alcohol Use: No    OB History    Grav Para Term Preterm Abortions TAB SAB Ect Mult Living                  Review of Systems  All other systems reviewed and are negative.    Allergies  Morphine and related and Latex  Home Medications   Current Outpatient Rx  Name Route Sig Dispense Refill  . CLOTRIMAZOLE 1 % EX CREA Topical Apply 1 application topically 2 (two) times daily.      . IBUPROFEN 200 MG PO TABS Oral Take 200 mg by mouth every 6 (six) hours as needed. For pain       BP 138/77  Pulse 95  Temp(Src) 98.8 F (37.1 C) (Oral)  Resp 20  SpO2 98%  Physical Exam  Nursing note and vitals reviewed. Constitutional: She is oriented to person, place, and time. She appears well-developed and well-nourished. No distress.       Uncomfortable appearing  HENT:  Head: Normocephalic and atraumatic.  Eyes:       Normal appearance  Neck: Normal range of motion.  Neurological: She is alert and oriented to person, place, and time.  Skin:       2.5cm abscess w/ overlying erythema and surrounding induration left axilla.  Very ttp.    Psychiatric: She has a normal mood and affect. Her behavior  is normal.    ED Course  Procedures (including critical care time) INCISION AND DRAINAGE Performed by: Otilio Miu Consent: Verbal consent obtained. Risks and benefits: risks, benefits and alternatives were discussed Type: abscess  Body area: left axilla  Anesthesia: local infiltration  Local anesthetic: lidocaine 2% w/ epinephrine  Anesthetic total:49ml  Complexity: complex Blunt dissection to break up loculations  Drainage: purulent  Drainage amount: large  Packing material: 1/4 in iodoform gauze  Patient tolerance: Patient tolerated the procedure well with no immediate complications.    Labs Reviewed - No data to display No results found.   1. Abscess of left axilla       MDM  Pt presents w/ abscess of L axilla.  Drained and packed.  Pt prescribed pain medication and referred to Urgent Care for recheck as well as [facility].          Arie Sabina [place], [place] 09/16/11 1535

## 2011-09-16 NOTE — ED Notes (Signed)
Pt c/o left axillary abscess; pt denies drainage but c/o pain

## 2011-09-16 NOTE — ED Notes (Signed)
Supplied By ortho tech

## 2011-09-16 NOTE — ED Notes (Signed)
Left under arm wound was I and D dressed w/ dsd pt given extra drsg for home w/ understanding in use

## 2011-09-17 NOTE — ED Provider Notes (Signed)
Medical screening examination/treatment/procedure(s) were performed by non-physician practitioner and as supervising physician I was immediately available for consultation/collaboration. Imaan Padgett Y.   Gavin Pound. Oletta Lamas, MD 09/17/11 [phone]

## 2011-09-18 ENCOUNTER — Emergency Department (INDEPENDENT_AMBULATORY_CARE_PROVIDER_SITE_OTHER)
Admission: EM | Admit: 2011-09-18 | Discharge: 2011-09-18 | Disposition: A | Discharge status: Home / Self Care | Payer: Self-pay | Source: Home / Self Care | Attending: Family Medicine | Admitting: Family Medicine

## 2011-09-18 ENCOUNTER — Encounter (HOSPITAL_COMMUNITY): Payer: Self-pay | Admitting: *Deleted

## 2011-09-18 DIAGNOSIS — IMO0002 Reserved for concepts with insufficient information to code with codable children: Secondary | ICD-10-CM

## 2011-09-18 DIAGNOSIS — L02419 Cutaneous abscess of limb, unspecified: Secondary | ICD-10-CM

## 2011-09-18 MED ORDER — SULFAMETHOXAZOLE-TRIMETHOPRIM 800-160 MG PO TABS: 1.0000 | ORAL_TABLET | Freq: Two times a day (BID) | ORAL | Status: AC

## 2011-09-18 MED ORDER — ONDANSETRON HCL 4 MG PO TABS: 4.0000 mg | ORAL_TABLET | Freq: Three times a day (TID) | ORAL | Status: AC | PRN

## 2011-09-18 NOTE — ED Notes (Signed)
Pt  Had  I  And  D   Of  The   abcess    underher  l  Arm  sev  Days  Ago  In the  Er    She  Has  Packing in place  -   She  Reports   She  Was  Only  rx  Pain pills  No  Anti biotics   She  Has  Pain redness  And  Swelling       She  Know  Has  Swelling  Under  r  Armpit  As  Well

## 2011-10-10 ENCOUNTER — Emergency Department (INDEPENDENT_AMBULATORY_CARE_PROVIDER_SITE_OTHER)
Admission: EM | Admit: 2011-10-10 | Discharge: 2011-10-10 | Disposition: A | Discharge status: Home / Self Care | Payer: Self-pay | Source: Home / Self Care | Attending: Emergency Medicine | Admitting: Emergency Medicine

## 2011-10-10 ENCOUNTER — Encounter (HOSPITAL_COMMUNITY): Payer: Self-pay | Admitting: Emergency Medicine

## 2011-10-10 DIAGNOSIS — K047 Periapical abscess without sinus: Secondary | ICD-10-CM

## 2011-10-10 DIAGNOSIS — L42 Pityriasis rosea: Secondary | ICD-10-CM

## 2011-10-10 LAB — POCT I-STAT, CHEM 8
Calcium, Ion: 1.14 mmol/L (ref 1.12–1.32)
Chloride: 107 mEq/L (ref 96–112)
Creatinine, Ser: 0.6 mg/dL (ref 0.50–1.10)
Glucose, Bld: 86 mg/dL (ref 70–99)
HCT: 39 % (ref 36.0–46.0)
Hemoglobin: 13.3 g/dL (ref 12.0–15.0)
Potassium: 3.2 mEq/L — ABNORMAL LOW (ref 3.5–5.1)

## 2011-10-10 LAB — CBC
HCT: 36.1 % (ref 36.0–46.0)
Hemoglobin: 12.5 g/dL (ref 12.0–15.0)
MCHC: 34.6 g/dL (ref 30.0–36.0)
MCV: 84.3 fL (ref 78.0–100.0)
RDW: 14 % (ref 11.5–15.5)

## 2011-10-10 MED ORDER — PREDNISONE 20 MG PO TABS: 40.0000 mg | ORAL_TABLET | Freq: Every day | ORAL | Status: AC

## 2011-10-10 MED ORDER — HYDROCHLOROTHIAZIDE 25 MG PO TABS: 25.0000 mg | ORAL_TABLET | Freq: Every day | ORAL | Status: DC

## 2011-10-10 MED ORDER — ACYCLOVIR 400 MG PO TABS: 400.0000 mg | ORAL_TABLET | Freq: Every day | ORAL | Status: AC

## 2011-10-10 MED ORDER — PENICILLIN V POTASSIUM 500 MG PO TABS: 500.0000 mg | ORAL_TABLET | Freq: Four times a day (QID) | ORAL | Status: AC

## 2011-10-10 NOTE — ED Provider Notes (Addendum)
History     CSN: [id number]  Arrival date & time 10/10/11  1336   First MD Initiated Contact with Patient 10/10/11 1440      Chief Complaint  Patient presents with  . Rash    (Consider location/radiation/quality/duration/timing/severity/associated sxs/prior treatment) Patient is a 36 y.o. female presenting with rash. The history is provided by the patient.  Rash  This is a recurrent problem. The problem has been gradually worsening. There has been no fever. The rash is present on the torso, back, right lower leg, right foot, left foot, left lower leg and trunk. The pain is at a severity of 4/10. The pain is mild. The pain has been constant since onset. Associated symptoms include itching. Pertinent negatives include no blisters and no weeping. She has tried nothing for the symptoms. The treatment provided no relief.    Past Medical History  Diagnosis Date  . Hypertension     Past Surgical History  Procedure Date  . C-sections   . Cholecystectomy   . Removal of axilla sweat glands     History reviewed. No pertinent family history.  History  Substance Use Topics  . Smoking status: Current Everyday Smoker  . Smokeless tobacco: Not on file  . Alcohol Use: No    OB History    Grav Para Term Preterm Abortions TAB SAB Ect Mult Living                  Review of Systems  Constitutional: Negative for fever, chills, diaphoresis, appetite change and fatigue.  HENT: Positive for facial swelling and dental problem.   Respiratory: Negative for shortness of breath.   Skin: Positive for color change, itching and rash.    Allergies  Morphine and related and Latex  Home Medications   Current Outpatient Rx  Name Route Sig Dispense Refill  . ACYCLOVIR 400 MG PO TABS Oral Take 1 tablet (400 mg total) by mouth 5 (five) times daily. 20 tablet 0  . CLOTRIMAZOLE 1 % EX CREA Topical Apply 1 application topically 2 (two) times daily.      Marland Kitchen HYDROCHLOROTHIAZIDE 25 MG PO TABS Oral  Take 1 tablet (25 mg total) by mouth daily. 30 tablet 0  . IBUPROFEN 200 MG PO TABS Oral Take 200 mg by mouth every 6 (six) hours as needed. For pain     . PENICILLIN V POTASSIUM 500 MG PO TABS Oral Take 1 tablet (500 mg total) by mouth 4 (four) times daily. 30 tablet 0  . PREDNISONE 20 MG PO TABS Oral Take 2 tablets (40 mg total) by mouth daily. 2 tablets daily for 5 days 10 tablet 0    BP 144/97  Pulse 78  Temp(Src) 98.6 F (37 C) (Oral)  Resp 20  SpO2 99%  LMP 09/07/2011  Physical Exam  Nursing note and vitals reviewed. Constitutional: No distress.  HENT:  Head: Normocephalic.  Mouth/Throat: Uvula is midline, oropharynx is clear and moist and mucous membranes are normal. Dental caries present. No oropharyngeal exudate.    Skin: No bruising, no petechiae and no purpura noted. Rash is not papular. She is not diaphoretic. Nails show no clubbing.       ED Course  Procedures (including critical care time)  Labs Reviewed  POCT I-STAT, CHEM 8 - Abnormal; Notable for the following:    Potassium 3.2 (*)    BUN 5 (*)    All other components within normal limits  CBC  RPR   No results found.  1. Pityriasis rosea   2. Dental abscess       MDM  Patient presented with multiple concerns, including and ongoing but worsening rash recommended follow-up with dermatologist as its difficult to discern if erythema-multiforme vs pityriasis rosea, also ruling out secondary syphilis and appears not to be a vasculitis type rash. Also with ongoing molar pain with 2 days history of localized facial swelling Previous history of HTN requesting prescriptions. Also with newly developed Herpes Labialis, Discussed multiple issues with patient and spend more than 30 mints in direct patient care, and stressed importance follow-up with both a PCP and the dermatologist        Rosana Hoes, MD 10/10/11 [place], MD 10/10/11 2230

## 2011-10-10 NOTE — ED Notes (Signed)
Patient has a rash all over, scattered pattern, rash does not itch, red dots scattered all over extremities.  Patient was seen for cyst in axilla, at that time only had rash to left forearm, since then has spread.  Patient also has concerned for palpable knots on soles of feet that resolve without intervention.  Patient has multiple complaints

## 2011-10-10 NOTE — ED Notes (Signed)
Patient persisted in having concern for blood pressure.  Dr Ladon Applebaum wrote script for bp medicine.  Instructed patient to wait 2 weeks before starting.  Explained she had enough going on and had been prescribed medicines for rash that needed to be resolved before starting any new medicine.  Patient agreed

## 2011-10-21 NOTE — ED Provider Notes (Signed)
History     CSN: [id number]  Arrival date & time 09/18/11  1313   First MD Initiated Contact with Patient 09/18/11 1352      Chief Complaint  Patient presents with  . Wound Check    (Consider location/radiation/quality/duration/timing/severity/associated sxs/prior treatment) HPI Comments: Mindy [name] presents for follow up of an axillary abscess that she had I&D'd in the ED 2 days ago. She denies being given a rx for antibiotics. She did receive pain medication which has helped.   Patient is a 36 y.o. female presenting with wound check. The history is provided by the patient.  Wound Check  She was treated in the ED 2 to 3 days ago. Previous treatment in the ED includes I&D of abscess. Treatments since wound repair include regular soap and water washings. Her temperature was unmeasured prior to arrival. There has been colored discharge from the wound. There is new redness present. There is new swelling present.    Past Medical History  Diagnosis Date  . Hypertension     Past Surgical History  Procedure Date  . C-sections   . Cholecystectomy   . Removal of axilla sweat glands     History reviewed. No pertinent family history.  History  Substance Use Topics  . Smoking status: Current Everyday Smoker  . Smokeless tobacco: Not on file  . Alcohol Use: No    OB History    Grav Para Term Preterm Abortions TAB SAB Ect Mult Living                  Review of Systems  Constitutional: Negative.   HENT: Negative.   Eyes: Negative.   Respiratory: Negative.   Cardiovascular: Negative.   Gastrointestinal: Negative.   Genitourinary: Negative.   Musculoskeletal: Negative.   Skin: Positive for color change and wound.  Neurological: Negative.     Allergies  Morphine and related and Latex  Home Medications   Current Outpatient Rx  Name Route Sig Dispense Refill  . CLOTRIMAZOLE 1 % EX CREA Topical Apply 1 application topically 2 (two) times daily.      Marland Kitchen HYDROCHLOROTHIAZIDE  25 MG PO TABS Oral Take 1 tablet (25 mg total) by mouth daily. 30 tablet 0  . IBUPROFEN 200 MG PO TABS Oral Take 200 mg by mouth every 6 (six) hours as needed. For pain     . PREDNISONE 20 MG PO TABS Oral Take 2 tablets (40 mg total) by mouth daily. 2 tablets daily for 5 days 10 tablet 0    BP 165/99  Pulse 74  Temp(Src) 98.8 F (37.1 C) (Oral)  Resp 20  SpO2 100%  LMP 09/07/2011  Physical Exam  Nursing note and vitals reviewed. Constitutional: She is oriented to person, place, and time. She appears well-developed and well-nourished.  HENT:  Head: Normocephalic and atraumatic.  Eyes: EOM are normal.  Neck: Normal range of motion.  Pulmonary/Chest: Effort normal.  Musculoskeletal: Normal range of motion.  Neurological: She is alert and oriented to person, place, and time.  Skin: Skin is warm and dry.       Open wound under LEFT abscess with packing in place; packing removed; surrounding redness and mild tenderness  Psychiatric: Her behavior is normal.    ED Course  Procedures (including critical care time)  Labs Reviewed - No data to display No results found.   1. Abscess of axilla       MDM  rx given for TMP-SMX; follow up if symptoms do not improve  Richardo Priest, MD 10/21/11 2034

## 2012-07-18 ENCOUNTER — Emergency Department (HOSPITAL_COMMUNITY)
Admission: EM | Admit: 2012-07-18 | Discharge: 2012-07-18 | Disposition: A | Discharge status: Home / Self Care | Payer: Self-pay | Attending: Emergency Medicine | Admitting: Emergency Medicine

## 2012-07-18 ENCOUNTER — Encounter (HOSPITAL_COMMUNITY): Payer: Self-pay

## 2012-07-18 DIAGNOSIS — K0889 Other specified disorders of teeth and supporting structures: Secondary | ICD-10-CM

## 2012-07-18 DIAGNOSIS — K089 Disorder of teeth and supporting structures, unspecified: Secondary | ICD-10-CM | POA: Insufficient documentation

## 2012-07-18 DIAGNOSIS — I10 Essential (primary) hypertension: Secondary | ICD-10-CM | POA: Insufficient documentation

## 2012-07-18 DIAGNOSIS — K029 Dental caries, unspecified: Secondary | ICD-10-CM | POA: Insufficient documentation

## 2012-07-18 DIAGNOSIS — F17200 Nicotine dependence, unspecified, uncomplicated: Secondary | ICD-10-CM | POA: Insufficient documentation

## 2012-07-18 MED ORDER — HYDROCODONE-ACETAMINOPHEN 5-325 MG PO TABS: 1.0000 | ORAL_TABLET | ORAL | Status: DC | PRN

## 2012-07-18 MED ORDER — PENICILLIN V POTASSIUM 500 MG PO TABS: 500.0000 mg | ORAL_TABLET | Freq: Three times a day (TID) | ORAL | Status: DC

## 2012-07-18 NOTE — ED Provider Notes (Signed)
Medical screening examination/treatment/procedure(s) were performed by non-physician practitioner and as supervising physician I was immediately available for consultation/collaboration.  Olivia Mackie, MD 07/18/12 2141

## 2012-07-18 NOTE — ED Notes (Signed)
Pt complains of dental pain, onset 2 days ago.

## 2012-07-18 NOTE — ED Provider Notes (Signed)
History     CSN: [id number]  Arrival date & time 07/18/12  0207   First MD Initiated Contact with Patient 07/18/12 0239      Chief Complaint  Patient presents with  . Dental Pain   HPI  History provided by the patient. Patient is a 36 year old female with history of hypertension who presents with complaints of right upper dental pain. Pain has been present for the past 2-3 days and worsening. Pain radiates into the right side of the face. She denies any swelling of the gums or face. She denies any fever, chills or sweats. Patient states she was seen by a dentist and had x-rays performed and is waiting for her her next appointment. She has been using ibuprofen for symptoms without significant relief. Pain has been disrupting her sleep. She denies any other complaints.    Past Medical History  Diagnosis Date  . Hypertension     Past Surgical History  Procedure Date  . C-sections   . Cholecystectomy   . Removal of axilla sweat glands     No family history on file.  History  Substance Use Topics  . Smoking status: Current Every Day Smoker  . Smokeless tobacco: Not on file  . Alcohol Use: No    OB History    Grav Para Term Preterm Abortions TAB SAB Ect Mult Living                  Review of Systems  Constitutional: Negative for fever and chills.  HENT: Positive for dental problem. Negative for sore throat, drooling and trouble swallowing.   Gastrointestinal: Negative for nausea and vomiting.    Allergies  Morphine and related and Latex  Home Medications   Current Outpatient Rx  Name  Route  Sig  Dispense  Refill  . IBUPROFEN 200 MG PO TABS   Oral   Take 200 mg by mouth every 6 (six) hours as needed. For pain            BP 145/96  Pulse 78  Temp 98 F (36.7 C) (Oral)  Resp 18  SpO2 99%  Physical Exam  Nursing note and vitals reviewed. Constitutional: She is oriented to person, place, and time. She appears well-developed and well-nourished. No  distress.  HENT:  Head: Normocephalic.  Mouth/Throat: Oropharynx is clear and moist.         There is dental care in pain to percussion over right upper second premolar and first molar. No significant swelling of the gums. No swelling of the face.  Neck: Normal range of motion. Neck supple.  Cardiovascular: Normal rate and regular rhythm.   No murmur heard. Pulmonary/Chest: Effort normal and breath sounds normal. No respiratory distress. She has no rales.  Lymphadenopathy:    She has no cervical adenopathy.  Neurological: She is alert and oriented to person, place, and time.  Skin: Skin is warm and dry.  Psychiatric: She has a normal mood and affect. Her behavior is normal.    ED Course  Procedures    Dental Block Performed by: Angus Seller Authorized by: Angus Seller Consent: Verbal consent obtained. Risks and benefits: risks, benefits and alternatives were discussed Consent given by: patient Patient identity confirmed: provided demographic data  Location: Right upper second premolar  Local anesthetic: Bupivacaine 0.5% with epinephrine  Anesthetic total: 1.8 ml  Irrigation method: syringe  Patient tolerance: Patient tolerated the procedure well with no immediate complications. Pain improved.    1. Pain, dental  2. Dental caries       MDM  3:10 AM patient seen and evaluated. Patient uncomfortable but in no acute distress.        Angus Seller, [place] 07/18/12 2118

## 2013-06-11 ENCOUNTER — Encounter (HOSPITAL_COMMUNITY): Payer: Self-pay | Admitting: Emergency Medicine

## 2013-06-11 ENCOUNTER — Emergency Department (HOSPITAL_COMMUNITY)
Admission: EM | Admit: 2013-06-11 | Discharge: 2013-06-11 | Disposition: A | Discharge status: Home / Self Care | Payer: Self-pay | Source: Home / Self Care

## 2013-06-11 DIAGNOSIS — IMO0002 Reserved for concepts with insufficient information to code with codable children: Secondary | ICD-10-CM

## 2013-06-11 DIAGNOSIS — L02411 Cutaneous abscess of right axilla: Secondary | ICD-10-CM

## 2013-06-11 DIAGNOSIS — I10 Essential (primary) hypertension: Secondary | ICD-10-CM

## 2013-06-11 MED ORDER — DOXYCYCLINE HYCLATE 100 MG PO CAPS: 100.0000 mg | ORAL_CAPSULE | Freq: Two times a day (BID) | ORAL | Status: DC

## 2013-06-11 MED ORDER — HYDROCODONE-ACETAMINOPHEN 5-325 MG PO TABS: 1.0000 | ORAL_TABLET | Freq: Four times a day (QID) | ORAL | Status: DC | PRN

## 2013-06-11 MED ORDER — LISINOPRIL-HYDROCHLOROTHIAZIDE 10-12.5 MG PO TABS: 1.0000 | ORAL_TABLET | Freq: Every day | ORAL | Status: DC

## 2013-06-11 NOTE — ED Provider Notes (Signed)
CSN: [id number]     Arrival date & time 06/11/13  1335 History   None    Chief Complaint  Patient presents with  . Abscess  . Medication Refill   (Consider location/radiation/quality/duration/timing/severity/associated sxs/prior Treatment) Patient is a 37 y.o. female presenting with abscess. The history is provided by the patient.  Abscess Location:  Shoulder/arm Shoulder/arm abscess location:  R axilla Abscess quality: fluctuance, painful, redness and warmth   Red streaking: no   Duration:  2 days Progression:  Worsening Pain details:    Quality:  Pressure, sharp and shooting   Severity:  Moderate   Progression:  Worsening Chronicity:  Recurrent Context comment:  S/p gland excision  yrs ago to prevent abscesses   Past Medical History  Diagnosis Date  . Hypertension    Past Surgical History  Procedure Laterality Date  . C-sections    . Cholecystectomy    . Removal of axilla sweat glands     History reviewed. No pertinent family history. History  Substance Use Topics  . Smoking status: Current Every Day Smoker  . Smokeless tobacco: Not on file  . Alcohol Use: No   OB History   Grav Para Term Preterm Abortions TAB SAB Ect Mult Living                 Review of Systems  Constitutional: Negative.   Skin: Positive for rash.    Allergies  Morphine and related and Latex  Home Medications   Current Outpatient Rx  Name  Route  Sig  Dispense  Refill  . doxycycline (VIBRAMYCIN) 100 MG capsule   Oral   Take 1 capsule (100 mg total) by mouth 2 (two) times daily.   20 capsule   0   . HYDROcodone-acetaminophen (NORCO) 5-325 MG per tablet   Oral   Take 1-2 tablets by mouth every 4 (four) hours as needed for pain.   20 tablet   0   . HYDROcodone-acetaminophen (NORCO/VICODIN) 5-325 MG per tablet   Oral   Take 1 tablet by mouth every 6 (six) hours as needed for pain.   6 tablet   0   . ibuprofen (ADVIL,MOTRIN) 200 MG tablet   Oral   Take 200 mg by mouth  every 6 (six) hours as needed. For pain          . lisinopril-hydrochlorothiazide (PRINZIDE,ZESTORETIC) 10-12.5 MG per tablet   Oral   Take 1 tablet by mouth daily.   30 tablet   0   . penicillin v potassium (VEETID) 500 MG tablet   Oral   Take 1 tablet (500 mg total) by mouth 3 (three) times daily.   30 tablet   0    BP 177/110  Pulse 84  Temp(Src) 98.4 F (36.9 C) (Oral)  Resp 16  SpO2 100%  LMP 05/22/2013 Physical Exam  Nursing note and vitals reviewed. Constitutional: She is oriented to person, place, and time. She appears well-developed and well-nourished.  Neurological: She is alert and oriented to person, place, and time.  Skin: Skin is warm and dry. Rash noted.  Abscess to right axilla under surg scar.    ED Course  INCISION AND DRAINAGE Date/Time: 06/11/2013 2:42 PM Performed by: Linna Hoff Authorized by: Bradd Canary D Consent: Verbal consent obtained. Consent given by: patient Type: abscess Body area: trunk Location details: chest Local anesthetic: topical anesthetic Patient sedated: no Scalpel size: 11 Incision type: single straight Complexity: simple Drainage: purulent Drainage amount: moderate Packing material: 1/4  in iodoform gauze Patient tolerance: Patient tolerated the procedure well with no immediate complications. Comments: Culture obtained.   (including critical care time) Labs Review Labs Reviewed  CULTURE, ROUTINE-ABSCESS   Imaging Review No results found.  MDM      Linna Hoff, MD 06/11/13 [phone]

## 2013-06-11 NOTE — ED Notes (Signed)
Call from patient, Rx too expensive, and minocycline is less expensive, per [facility] pharmacist, Arma Heading . Spoke w Dr Artis Flock, who authorized substitution, same instructions . Spoke w pharmacist

## 2013-06-11 NOTE — ED Notes (Signed)
C/o abscess right axillary x 2 days. Pain and swelling denies drainage. Pt has use warm compresses with no relief. Hx of surgery to remove sweat glands from right axillary.

## 2013-06-15 LAB — CULTURE, ROUTINE-ABSCESS

## 2013-07-27 ENCOUNTER — Emergency Department (INDEPENDENT_AMBULATORY_CARE_PROVIDER_SITE_OTHER)
Admission: EM | Admit: 2013-07-27 | Discharge: 2013-07-27 | Disposition: A | Discharge status: Home / Self Care | Payer: Self-pay | Source: Home / Self Care | Attending: Emergency Medicine | Admitting: Emergency Medicine

## 2013-07-27 ENCOUNTER — Encounter (HOSPITAL_COMMUNITY): Payer: Self-pay | Admitting: Emergency Medicine

## 2013-07-27 DIAGNOSIS — L089 Local infection of the skin and subcutaneous tissue, unspecified: Secondary | ICD-10-CM

## 2013-07-27 DIAGNOSIS — L723 Sebaceous cyst: Secondary | ICD-10-CM

## 2013-07-27 MED ORDER — SULFAMETHOXAZOLE-TMP DS 800-160 MG PO TABS: 2.0000 | ORAL_TABLET | Freq: Two times a day (BID) | ORAL | Status: DC

## 2013-07-27 MED ORDER — LISINOPRIL-HYDROCHLOROTHIAZIDE 10-12.5 MG PO TABS: 1.0000 | ORAL_TABLET | Freq: Every day | ORAL | Status: DC

## 2013-07-27 MED ORDER — HYDROCODONE-ACETAMINOPHEN 5-325 MG PO TABS: ORAL_TABLET | ORAL | Status: DC

## 2013-07-27 MED ORDER — MUPIROCIN 2 % EX OINT: 1.0000 "application " | TOPICAL_OINTMENT | Freq: Three times a day (TID) | CUTANEOUS | Status: DC

## 2013-07-27 NOTE — ED Notes (Signed)
Pt reports having a "lump" right side of her neck  at the site of an abscess the past 7-8 years from and I and D  10 years ago.  Large amt swelling notes with enlarged nearby nodes.  She denies fever

## 2013-07-27 NOTE — ED Provider Notes (Signed)
Chief Complaint:   Chief Complaint  Patient presents with  . Abscess    History of Present Illness:    Mindy [name] is a 37 year old female who has had a 10 year history of a large cyst in the right side of her neck. About 2 days ago this became inflamed and painful. No fever or purulent drainage. The patient states she has a history of multiple abscesses in the axillas, groin areas, and all over her entire body.  Review of Systems:  Other than noted above, the patient denies any of the following symptoms: Systemic:  No fever, chills or sweats. Skin:  No rash or itching.  PMFSH:  Past medical history, family history, social history, meds, and allergies were reviewed.  No history of diabetes or prior history of abscesses or MRSA. She is supposed to be on lisinopril/HCTZ for blood pressure, but she's run out and does not have a primary care Dr. she can go back to.  Physical Exam:   Vital signs:  BP 177/96  Pulse 78  Temp(Src) 98.4 F (36.9 C) (Oral)  SpO2 98%  LMP 07/10/2013 Skin:  There is a 3.5 x 3.5 cm cystic mass on her right posterior neck that's fluctuant, tender, and inflamed.  Skin exam was otherwise normal.  No rash. Ext:  Distal pulses were full, patient has full ROM of all joints.  Procedure:  Verbal informed consent was obtained.  The patient was informed of the risks and benefits of the procedure and understands and accepts.  Identity of the patient was verified verbally and by wristband.   The abscess area described above was prepped with Betadine and alcohol and anesthetized with 3 mL of 2% Xylocaine with epinephrine.  Using a #11 scalpel blade, a singe straight incision was made into the area of fluctulence, yielding a large amount of prurulent drainage and squamous debris. Attempted to remove as much squamous debris as possible, but I believe some was left in place.  Routine cultures were obtained.  Blunt dissection was used to break up loculations and the resulting wound  cavity was packed with 1/4 inch Iodoform gauze.  A sterile pressure dressing was applied.  Assessment:  The encounter diagnosis was Infected sebaceous cyst.  This was too large to remove all of the squamous debris, and will come back again if it's not removed completely. The patient states that she has had removed in the past but has always come back again. Suggested that she consult a surgeon for surgical removal.  Plan:   1.  Meds:  The following meds were prescribed:   Discharge Medication List as of 07/27/2013  4:11 PM    START taking these medications   Details  !! HYDROcodone-acetaminophen (NORCO/VICODIN) 5-325 MG per tablet 1 to 2 tabs every 4 to 6 hours as needed for pain., Print    !! lisinopril-hydrochlorothiazide (ZESTORETIC) 10-12.5 MG per tablet Take 1 tablet by mouth daily., Starting 07/27/2013, Until Discontinued, Normal    mupirocin ointment (BACTROBAN) 2 % Apply 1 application topically 3 (three) times daily., Starting 07/27/2013, Until Discontinued, Normal    sulfamethoxazole-trimethoprim (BACTRIM DS) 800-160 MG per tablet Take 2 tablets by mouth 2 (two) times daily., Starting 07/27/2013, Until Discontinued, Normal     !! - Potential duplicate medications found. Please discuss with provider.      2.  Patient Education/Counseling:  The patient was given appropriate handouts, self care instructions, and instructed in symptomatic relief.    3.  Follow up:  The  patient was instructed to leave the dressing in place and return here again in 48 hours for packing removal, if becoming worse in any way, and given some red flag symptoms such as fever which would prompt immediate return.  Follow up with Dr. Avel Peace for definitive removal at a later date.     Reuben Likes, MD 07/27/13 [phone]

## 2013-07-30 LAB — CULTURE, ROUTINE-ABSCESS: Culture: NO GROWTH

## 2013-07-31 ENCOUNTER — Encounter (HOSPITAL_COMMUNITY): Payer: Self-pay | Admitting: Emergency Medicine

## 2013-07-31 ENCOUNTER — Emergency Department (INDEPENDENT_AMBULATORY_CARE_PROVIDER_SITE_OTHER)
Admission: EM | Admit: 2013-07-31 | Discharge: 2013-07-31 | Disposition: A | Discharge status: Home / Self Care | Payer: Self-pay | Source: Home / Self Care | Attending: Family Medicine | Admitting: Family Medicine

## 2013-07-31 DIAGNOSIS — L0291 Cutaneous abscess, unspecified: Secondary | ICD-10-CM

## 2013-07-31 DIAGNOSIS — Z4800 Encounter for change or removal of nonsurgical wound dressing: Secondary | ICD-10-CM

## 2013-07-31 NOTE — ED Provider Notes (Signed)
Mindy [name] is a 37 y.o. female who presents to Urgent Care today for followup abscess right neck. Patient was seen on November 18 for infected sebaceous cyst on the right lateral neck. She had incision and drainage and packing of the wound. Culture was obtained which is so far negative. She was placed on Bactrim antibiotics prophylactically. She notes considerable improvement in her symptoms. She has not removed the packing material yet. No fevers or chills   Past Medical History  Diagnosis Date  . Hypertension    History  Substance Use Topics  . Smoking status: Current Every Day Smoker  . Smokeless tobacco: Not on file  . Alcohol Use: No   ROS as above Medications reviewed. No current facility-administered medications for this encounter.   Current Outpatient Prescriptions  Medication Sig Dispense Refill  . sulfamethoxazole-trimethoprim (BACTRIM DS) 800-160 MG per tablet Take 2 tablets by mouth 2 (two) times daily.  40 tablet  0  . HYDROcodone-acetaminophen (NORCO) 5-325 MG per tablet Take 1-2 tablets by mouth every 4 (four) hours as needed for pain.  20 tablet  0  . HYDROcodone-acetaminophen (NORCO/VICODIN) 5-325 MG per tablet 1 to 2 tabs every 4 to 6 hours as needed for pain.  20 tablet  0  . ibuprofen (ADVIL,MOTRIN) 200 MG tablet Take 200 mg by mouth every 6 (six) hours as needed. For pain       . lisinopril-hydrochlorothiazide (PRINZIDE,ZESTORETIC) 10-12.5 MG per tablet Take 1 tablet by mouth daily.  30 tablet  0  . lisinopril-hydrochlorothiazide (ZESTORETIC) 10-12.5 MG per tablet Take 1 tablet by mouth daily.  30 tablet  5  . mupirocin ointment (BACTROBAN) 2 % Apply 1 application topically 3 (three) times daily.  22 g  0  . penicillin v potassium (VEETID) 500 MG tablet Take 1 tablet (500 mg total) by mouth 3 (three) times daily.  30 tablet  0    Exam:  BP 158/96  Pulse 73  Temp(Src) 98.9 F (37.2 C) (Oral)  Resp 22  SpO2 99%  LMP 07/10/2013 Gen: Well NAD SKIN:  Well-appearing wound with mild discharge. No surrounding erythema induration or tenderness. Packing material in place.  The packing was removed the wound was explored and debrided with a blunt Q-tip and was repacked with 2 inches of 1/2 inch packing material.   No results found for this or any previous visit (from the past 24 hour(s)). No results found.  Assessment and Plan: 37 y.o. female with followup right neck skin abscess. Plan to continue antibiotics. Remove packing material on her own in 3 days. Followup with general surgery for definitive removal of the sebaceous cyst when no longer infected. Discussed warning signs or symptoms. Please see discharge instructions. Patient expresses understanding.      Rodolph Bong, MD 07/31/13 [phone]

## 2013-07-31 NOTE — ED Notes (Signed)
Pt is here for a f/u and to have packing removed  States she believes she may have dental pain as well; doesn't know if the pain is due to the abscess Alert w/no signs of acute distress.

## 2013-09-15 ENCOUNTER — Emergency Department (INDEPENDENT_AMBULATORY_CARE_PROVIDER_SITE_OTHER)
Admission: EM | Admit: 2013-09-15 | Discharge: 2013-09-15 | Disposition: A | Discharge status: Home / Self Care | Payer: Self-pay | Source: Home / Self Care | Attending: Emergency Medicine | Admitting: Emergency Medicine

## 2013-09-15 ENCOUNTER — Encounter (HOSPITAL_COMMUNITY): Payer: Self-pay | Admitting: Emergency Medicine

## 2013-09-15 DIAGNOSIS — L0390 Cellulitis, unspecified: Secondary | ICD-10-CM

## 2013-09-15 DIAGNOSIS — L0291 Cutaneous abscess, unspecified: Secondary | ICD-10-CM

## 2013-09-15 MED ORDER — SULFAMETHOXAZOLE-TMP DS 800-160 MG PO TABS: 2.0000 | ORAL_TABLET | Freq: Two times a day (BID) | ORAL | Status: DC

## 2013-09-15 MED ORDER — SALICYLIC ACID 17 % EX SOLN: Freq: Every day | CUTANEOUS | Status: DC

## 2013-09-15 MED ORDER — MUPIROCIN 2 % EX OINT: TOPICAL_OINTMENT | CUTANEOUS | Status: DC

## 2013-09-15 MED ORDER — HYDROCODONE-ACETAMINOPHEN 5-325 MG PO TABS: ORAL_TABLET | ORAL | Status: DC

## 2013-09-15 MED ORDER — LISINOPRIL-HYDROCHLOROTHIAZIDE 10-12.5 MG PO TABS: 1.0000 | ORAL_TABLET | Freq: Every day | ORAL | Status: DC

## 2013-09-15 NOTE — ED Provider Notes (Signed)
Chief Complaint:   Chief Complaint  Patient presents with  . Recurrent Skin Infections    History of Present Illness:    Mindy [name] is a 38 year old female who has a boil on her left posterior thigh for the past 3 days. She had a large sebaceous cyst on her right posterior neck that was incised and drained about 2 months ago. It has healed up, but there is still a lump there. She states it still been draining some fluid. She denies any fever or chills. She has had multiple boils or infections in the past.  Review of Systems:  Other than noted above, the patient denies any of the following symptoms: Systemic:  No fever, chills or sweats. Skin:  No rash or itching.  PMFSH:  Past medical history, family history, social history, meds, and allergies were reviewed.  No history of diabetes or prior history of MRSA.  Physical Exam:   Vital signs:  BP 167/93  Pulse 83  Temp(Src) 98.3 F (36.8 C) (Oral)  Resp 20  SpO2 98%  LMP 09/02/2013 Skin:  There is a 3 x 3 cm, raised, red, tender, indurated mass on the left posterior thigh. The sebaceous cyst on the right posterior neck is healed up, but I can still feel the cyst present. It's not inflamed, and there is no purulent drainage.  Skin exam was otherwise normal.  No rash. Ext:  Distal pulses were full, patient has full ROM of all joints.  Procedure:  Verbal informed consent was obtained.  The patient was informed of the risks and benefits of the procedure and understands and accepts.  Identity of the patient was verified verbally and by wristband.   The abscess area described above was prepped with Betadine and alcohol and anesthetized with 3 mL of 2% Xylocaine with epinephrine.  Using a #11 scalpel blade, a singe straight incision was made into the area of fluctulence, yielding a large amount of prurulent drainage.  Routine cultures were obtained.  Blunt dissection was used to break up loculations and the resulting wound cavity was packed  with 1/4 inch Iodoform gauze.  A sterile pressure dressing was applied.  Assessment:  The encounter diagnosis was Abscess.  Plan:   1.  Meds:  The following meds were prescribed:   Discharge Medication List as of 09/15/2013  5:59 PM    START taking these medications   Details  !! HYDROcodone-acetaminophen (NORCO/VICODIN) 5-325 MG per tablet 1 to 2 tabs every 4 to 6 hours as needed for pain., Print    !! lisinopril-hydrochlorothiazide (ZESTORETIC) 10-12.5 MG per tablet Take 1 tablet by mouth daily., Starting 09/15/2013, Until Discontinued, Normal    !! mupirocin ointment (BACTROBAN) 2 % Apply to both nostrils TID for 1 month., Normal    !! sulfamethoxazole-trimethoprim (BACTRIM DS) 800-160 MG per tablet Take 2 tablets by mouth 2 (two) times daily., Starting 09/15/2013, Until Discontinued, Normal    salicylic acid-lactic acid 17 % external solution Apply topically daily., Starting 09/15/2013, Until Discontinued, Normal     !! - Potential duplicate medications found. Please discuss with provider.      2.  Patient Education/Counseling:  The patient was given appropriate handouts, self care instructions, and instructed in symptomatic relief.  Will treat with Septra and hydrocodone for the pain. Return in 2 days for packing removal. Instructed in a decontamination regimen, since she has had recurring skin infections and boils. She was given salicylic acid solution for what appears to be a plantar wart.  She was also given a refill for her lisinopril/hydrochlorothiazide since this has run out. The sebaceous cyst on the back of the neck will require surgical excision.  3.  Follow up:  The patient was instructed to leave the dressing in place and return again in 48 hours for packing removal, if becoming worse in any way, and given some red flag symptoms such as fever which would prompt immediate return.  Follow up here in 48 hours.     Reuben Likes, MD 09/15/13 2228

## 2013-09-15 NOTE — Discharge Instructions (Signed)
You have had an abscess drained.  An abscess is a collection of pus caused by infection with skin bacteria such as Streptococcus or Staphylococcus.  Since this is and infection, you may be contagious. ° °For the first 2 days, leave the dressing in place and keep it clean and dry. This means you should not get it wet.  You will have to take a sponge bath rather than a shower.  If the abscess was packed, we may instruct you to come back in 2 to 3 days to have the packing removed.  If the abscess was not packed, you may remove the dressing yourself in 2 days and take care of the wound yourself. ° °After the packing is out, change the dressing at least once a day.  You may bathe or shower once the packing has been removed.  Assemble all the dressing material before you change the dressing, wear gloves, dispose of the soiled dressing material well and wash your hands before and after changing the dressing.  Wash the area well with soap and water, taking care to remove all the dried blood and drainage.  Apply a thin layer of antibiotic ointment (Bacitracin or Polysporin) around the abscess cavity, then apply a gauze dressing.  You may want to use a non-adherent dressing like Telfa.  Fasten this in place well with tape.  Continue to change the dressing until there is no further drainage. ° °Finish up the entire prescription of any antibiotics that you have been given. ° °Take infectious precautions since the bacteria that cause these abscesses may be contagious.  Wash hands frequently or use hand sanitizer, especially after touching the abscess area or changing dressings.  Do not allow anyone else to use your towel or washcloth and wash these items after each use until the abscess has healed.  You may want to use an antibacterial soap such as Dial or Safeguard or a prescription body wash like Hibiclens.  You also may want to consider spraying the tub or shower with a disinfectant such a Lysol until the abscess has  healed. ° °Things that should prompt you to return to the office for a recheck include:  Fever over 100 degrees, increasing pain or drainage, failure of the abscess to heal after 10 days, or other skin lesions elsewhere.  ° °Bacterial infection is a leading cause of boils, abscesses and skin infections.  While this can be the cause of serious infections, it is most often easily treated, prevented, and cured. ° °This bacteria,like other bacterial infections, is something you catch from somebody or something.  It can be transmitted from family, friends, casual acquaintances or even pets by close person-to-person contact or contact with an infected object.  Bacteria live on the skin and in the nasal passages.  It can invade into the skin through a break in the skin or sometimes it just invades by itself into a skin pore causing an area of redness, swelling and pain.  When this happens, it can cause a sticking sensation, so many people mistake it for an insect bite.   ° °If it causes a collection of pus (a boil or abscess) it should be drained.  Often packing or a drain will be left in place to allow the pus to drain for a few days.  This packing will need to be removed after 2 to 3 days.  If the wound is deep, it may need to be repacked.  If there is no collection of   pus (cellulitis) incision and drainage is not immediately necessary, but antibiotics will be prescribed.  Sometimes cellulitis will turn into an abscess, so if symptoms persist, it's best to return for a recheck. ° °With bacterial infection, recurrence can be a problem.  This is because the bacteria can continue to live on the skin and in the nasal passages, presenting a risk for re-infection.  There are some steps that you can take to completely eradicate the infection and thus prevent future infections: ° °· Finish up your antibiotics completely.   °· Bacteria often take up residence in the nasal passages.  If they are not eliminated from this reservoir,  the infection will return again and again.  Apply an antibiotic ointment, mupirocin, to both nostrils 3 times daily for a month.  °· Decontamination of the skin is also necessary. The current recommendation is Clorox baths twice weekly for 3 months.  This can be done by diluting 4 oz of Clorox bleach in a tub of bathwater.  Then soak in this Clorox solution up to your neck for 15 minutes. When you get out of the tub, do not towel dry, allow the Clorox water to dry off your skin on its own.  °· Take infectious precautions:  Wash or sanitize hands frequently, spray tub or shower with Lysol after use, us towels or wash cloths only once, then launder, do not share towels or wash cloths with anyone else, do not share clothing with anyone else, launder clothing and bed clothes frequently. ° ° ° ° °

## 2013-09-15 NOTE — ED Notes (Signed)
C/o soft ball size boil on inner left thigh that she noticed 3 days ago. Previous boil site on back of neck started draining on last night; greenish with a tint of red and smells. Tried OTC medications and heat with no relief. Pain is 10/10. Written by: Marga MelnickQuaNeisha Jones, SMA

## 2013-09-15 NOTE — ED Notes (Signed)
[facility] [address] called letting us know that pt does not want to p/u the Bactroban 2% and wants to know if that's ok... Spoke w/Dr. Lorenz CoasterKeller and he said it was was ok... Pt called back after speaking to Pharmacist and stated that he refused to fill Rx's and to have them filled at another pharmacy??? Adv Dr. Lorenz CoasterKeller and [facility] [address] per pt's request.

## 2013-09-15 NOTE — ED Notes (Signed)
Call back number verified.  

## 2013-09-17 ENCOUNTER — Encounter (HOSPITAL_COMMUNITY): Payer: Self-pay | Admitting: Emergency Medicine

## 2013-09-17 ENCOUNTER — Emergency Department (INDEPENDENT_AMBULATORY_CARE_PROVIDER_SITE_OTHER)
Admission: EM | Admit: 2013-09-17 | Discharge: 2013-09-17 | Disposition: A | Discharge status: Home / Self Care | Payer: Self-pay | Source: Home / Self Care

## 2013-09-17 DIAGNOSIS — Z5189 Encounter for other specified aftercare: Secondary | ICD-10-CM

## 2013-09-17 DIAGNOSIS — Z4800 Encounter for change or removal of nonsurgical wound dressing: Secondary | ICD-10-CM

## 2013-09-17 DIAGNOSIS — L0390 Cellulitis, unspecified: Secondary | ICD-10-CM

## 2013-09-17 DIAGNOSIS — L0291 Cutaneous abscess, unspecified: Secondary | ICD-10-CM

## 2013-09-17 MED ORDER — ONDANSETRON 4 MG PO TBDP
4.0000 mg | ORAL_TABLET | Freq: Once | ORAL | Status: AC
  Administered 2013-09-17: 4 mg via ORAL

## 2013-09-17 MED ORDER — ONDANSETRON 4 MG PO TBDP
ORAL_TABLET | ORAL | Status: AC
  Filled 2013-09-17: qty 2

## 2013-09-17 MED ORDER — ONDANSETRON 4 MG PO TBDP: 4.0000 mg | ORAL_TABLET | Freq: Three times a day (TID) | ORAL | Status: DC | PRN

## 2013-09-17 NOTE — Discharge Instructions (Signed)
° ° °  Keep clean and dry. Change 4x4 tomorrow or if gets wet. If packing falls out that's ok, still keep covered and dry. No tub baths. Let us take another look on Sunday.

## 2013-09-17 NOTE — ED Notes (Signed)
Wound check, packing removal from wound on her thigh. Pustular drainage noted on dressing

## 2013-09-17 NOTE — ED Provider Notes (Signed)
CSN: [id number]     Arrival date & time 09/17/13  1812 History   None    Chief Complaint  Patient presents with  . Wound Check   (Consider location/radiation/quality/duration/timing/severity/associated sxs/prior Treatment) HPI Comments: Patient presents s/p 2 days I&D of sebaceous cyst and packing. She is doing well. No pain. She is having some nausea with the abx. No fever or chills.   Patient is a 38 y.o. female presenting with wound check. The history is provided by the patient.  Wound Check    Past Medical History  Diagnosis Date  . Hypertension    Past Surgical History  Procedure Laterality Date  . C-sections    . Cholecystectomy    . Removal of axilla sweat glands     History reviewed. No pertinent family history. History  Substance Use Topics  . Smoking status: Current Every Day Smoker  . Smokeless tobacco: Not on file  . Alcohol Use: No   OB History   Grav Para Term Preterm Abortions TAB SAB Ect Mult Living                 Review of Systems  All other systems reviewed and are negative.    Allergies  Morphine and related and Latex  Home Medications   Current Outpatient Rx  Name  Route  Sig  Dispense  Refill  . HYDROcodone-acetaminophen (NORCO) 5-325 MG per tablet   Oral   Take 1-2 tablets by mouth every 4 (four) hours as needed for pain.   20 tablet   0   . HYDROcodone-acetaminophen (NORCO/VICODIN) 5-325 MG per tablet      1 to 2 tabs every 4 to 6 hours as needed for pain.   20 tablet   0   . HYDROcodone-acetaminophen (NORCO/VICODIN) 5-325 MG per tablet      1 to 2 tabs every 4 to 6 hours as needed for pain.   20 tablet   0   . ibuprofen (ADVIL,MOTRIN) 200 MG tablet   Oral   Take 200 mg by mouth every 6 (six) hours as needed. For pain          . lisinopril-hydrochlorothiazide (PRINZIDE,ZESTORETIC) 10-12.5 MG per tablet   Oral   Take 1 tablet by mouth daily.   30 tablet   0   . lisinopril-hydrochlorothiazide (ZESTORETIC) 10-12.5 MG  per tablet   Oral   Take 1 tablet by mouth daily.   30 tablet   5   . lisinopril-hydrochlorothiazide (ZESTORETIC) 10-12.5 MG per tablet   Oral   Take 1 tablet by mouth daily.   30 tablet   5   . mupirocin ointment (BACTROBAN) 2 %   Topical   Apply 1 application topically 3 (three) times daily.   22 g   0   . mupirocin ointment (BACTROBAN) 2 %      Apply to both nostrils TID for 1 month.   22 g   0   . ondansetron (ZOFRAN ODT) 4 MG disintegrating tablet   Oral   Take 1 tablet (4 mg total) by mouth every 8 (eight) hours as needed for nausea or vomiting.   20 tablet   0   . penicillin v potassium (VEETID) 500 MG tablet   Oral   Take 1 tablet (500 mg total) by mouth 3 (three) times daily.   30 tablet   0   . salicylic acid-lactic acid 17 % external solution   Topical   Apply topically daily.  14 mL   0   . sulfamethoxazole-trimethoprim (BACTRIM DS) 800-160 MG per tablet   Oral   Take 2 tablets by mouth 2 (two) times daily.   40 tablet   0   . sulfamethoxazole-trimethoprim (BACTRIM DS) 800-160 MG per tablet   Oral   Take 2 tablets by mouth 2 (two) times daily.   40 tablet   0    BP 147/97  Pulse 77  Temp(Src) 98.1 F (36.7 C) (Oral)  Resp 18  SpO2 99%  LMP 09/02/2013 Physical Exam  Constitutional: She is oriented to person, place, and time. She appears well-developed and well-nourished. No distress.  HENT:  Head: Normocephalic and atraumatic.  Neurological: She is alert and oriented to person, place, and time.  Skin: Skin is warm and dry. No erythema.  Left upper medial thigh with clean and prulent drainage from wound. No surrounding erythema or warmth. No streaking. Area around wound is thinned and moist.    ED Course  Procedures (including critical care time) Labs Review Labs Reviewed - No data to display Imaging Review No results found.  EKG Interpretation    Date/Time:    Ventricular Rate:    PR Interval:    QRS Duration:   QT  Interval:    QTC Calculation:   R Axis:     Text Interpretation:              MDM   1. Abscess   2. Encounter for wound care   Packing removed. Wound is clean. Some note skin thinning from moisture. No streaking. Some continued drainage, therefore repacked today, irrigated, And will have return in 2 days for 1 final check. Zofran for N,V with abx.     Riki Sheer, PA-C 09/17/13 2031

## 2013-09-17 NOTE — ED Provider Notes (Signed)
Medical screening examination/treatment/procedure(s) were performed by non-physician practitioner and as supervising physician I was immediately available for consultation/collaboration.  Leslee Homeavid Jupiter Boys, M.D.  Reuben Likesavid C Altagracia Rone, MD 09/17/13 2157

## 2013-09-18 LAB — CULTURE, ROUTINE-ABSCESS

## 2013-09-20 ENCOUNTER — Encounter (HOSPITAL_COMMUNITY): Payer: Self-pay | Admitting: Emergency Medicine

## 2013-09-20 ENCOUNTER — Emergency Department (INDEPENDENT_AMBULATORY_CARE_PROVIDER_SITE_OTHER)
Admission: EM | Admit: 2013-09-20 | Discharge: 2013-09-20 | Disposition: A | Discharge status: Home / Self Care | Payer: Self-pay | Source: Home / Self Care | Attending: Emergency Medicine | Admitting: Emergency Medicine

## 2013-09-20 DIAGNOSIS — L0390 Cellulitis, unspecified: Secondary | ICD-10-CM

## 2013-09-20 DIAGNOSIS — L0291 Cutaneous abscess, unspecified: Secondary | ICD-10-CM

## 2013-09-20 NOTE — Discharge Instructions (Signed)
Wash with soap and water daily, apply antibiotic ointment, then gauze dressing.  Start Clorox baths in 1 week.

## 2013-09-20 NOTE — ED Notes (Signed)
Here  Today  For  Recheck  Of  boil

## 2013-09-20 NOTE — ED Provider Notes (Signed)
  Chief Complaint   Chief Complaint  Patient presents with  . Follow-up    History of Present Illness   Mindy [name] is a 38 year old female who comes in today for a recheck on an abscess on her left posterior thigh. This was I&D on the seventh of this month, she returned on the ninth for repacking, and returns today for recheck. She feels like it's healing up. It's only minimally sore right now. She denies any fever or chills. So far the culture is only growing out coag negative staph.  Review of Systems   Other than as noted above, the patient denies any of the following symptoms: Systemic:  No fever, chills or sweats. Skin:  No rash or itching.  PMFSH   Past medical history, family history, social history, meds, and allergies were reviewed.  No history of diabetes or prior history of abscesses or MRSA.   Physical Examination     Vital signs:  BP 139/83  Pulse 90  Temp(Src) 98.4 F (36.9 C) (Oral)  Resp 18  SpO2 100%  LMP 09/02/2013 Skin:  There is an abscess on the left posterior thigh. This has a small amount of packing in place. There is minimal surrounding erythema and no tenderness.  Skin exam was otherwise normal.  No rash. Ext:  Distal pulses were full, patient has full ROM of all joints.  Procedure   Verbal informed consent was obtained.  The patient was informed of the risks and benefits of the procedure and understands and accepts.  A time out was called and the identity of the patient and correct procedure was confirmed.   The packing was removed. The wound was cleansed with saline and antibiotic ointment was applied followed by sterile dressing.  Assessment   The encounter diagnosis was Abscess.  Appears to be healing well.  Plan     1.  Meds:  The following meds were prescribed:   Discharge Medication List as of 09/20/2013  5:27 PM      2.  Patient Education/Counseling:  The patient was given appropriate handouts, self care instructions, and  instructed in symptomatic relief.  Instructed in wound care.  3.  Follow up:  The patient was instructed to leave the dressing in place and return here again in 48 hours for packing removal, or sooner if becoming worse in any way, and given some red flag symptoms such as fever which would prompt immediate return.       Reuben Likesavid C Lanesha Azzaro, MD 09/20/13 2219

## 2013-09-21 NOTE — ED Notes (Signed)
Abscess culture L thigh: few Staph species (coagulase neg.).  Pt. adequately treated with I and D, Bactrim DS and Mupirocin oint.  Vassie MoselleYork, Haja Crego M 09/21/2013

## 2014-01-21 ENCOUNTER — Encounter (HOSPITAL_COMMUNITY): Payer: Self-pay | Admitting: Emergency Medicine

## 2014-01-21 ENCOUNTER — Emergency Department (INDEPENDENT_AMBULATORY_CARE_PROVIDER_SITE_OTHER)
Admission: EM | Admit: 2014-01-21 | Discharge: 2014-01-21 | Disposition: A | Discharge status: Home / Self Care | Payer: Self-pay | Source: Home / Self Care | Attending: Family Medicine | Admitting: Family Medicine

## 2014-01-21 DIAGNOSIS — N939 Abnormal uterine and vaginal bleeding, unspecified: Secondary | ICD-10-CM

## 2014-01-21 DIAGNOSIS — K029 Dental caries, unspecified: Secondary | ICD-10-CM

## 2014-01-21 DIAGNOSIS — N898 Other specified noninflammatory disorders of vagina: Secondary | ICD-10-CM

## 2014-01-21 MED ORDER — LIDOCAINE VISCOUS 2 % MT SOLN: OROMUCOSAL | Status: DC

## 2014-01-21 NOTE — ED Provider Notes (Signed)
CSN: [id number]     Arrival date & time 01/21/14  1620 History   First MD Initiated Contact with Patient 01/21/14 1827     Chief Complaint  Patient presents with  . Dental Pain   (Consider location/radiation/quality/duration/timing/severity/associated sxs/prior Treatment) HPI Comments: Also requests medication to regulate menstrual cycle.   Patient is a 38 y.o. female presenting with tooth pain. The history is provided by the patient.  Dental Pain Location:  Generalized Quality:  Aching Severity:  Mild Onset quality: +chronic. Duration: several years. Timing:  Intermittent   Past Medical History  Diagnosis Date  . Hypertension    Past Surgical History  Procedure Laterality Date  . C-sections    . Cholecystectomy    . Removal of axilla sweat glands     History reviewed. No pertinent family history. History  Substance Use Topics  . Smoking status: Current Every Day Smoker  . Smokeless tobacco: Not on file  . Alcohol Use: No   OB History   Grav Para Term Preterm Abortions TAB SAB Ect Mult Living                 Review of Systems  All other systems reviewed and are negative.   Allergies  Morphine and related and Latex  Home Medications   Prior to Admission medications   Medication Sig Start Date End Date Taking? Authorizing Provider  HYDROcodone-acetaminophen (NORCO) 5-325 MG per tablet Take 1-2 tablets by mouth every 4 (four) hours as needed for pain. 07/18/12   Angus SellerPeter S Dammen, PA-C  HYDROcodone-acetaminophen (NORCO/VICODIN) 5-325 MG per tablet 1 to 2 tabs every 4 to 6 hours as needed for pain. 07/27/13   Reuben Likesavid C Keller, MD  HYDROcodone-acetaminophen (NORCO/VICODIN) 5-325 MG per tablet 1 to 2 tabs every 4 to 6 hours as needed for pain. 09/15/13   Reuben Likesavid C Keller, MD  ibuprofen (ADVIL,MOTRIN) 200 MG tablet Take 200 mg by mouth every 6 (six) hours as needed. For pain     Historical Provider, MD  lisinopril-hydrochlorothiazide (PRINZIDE,ZESTORETIC) 10-12.5 MG per tablet  Take 1 tablet by mouth daily. 06/11/13   Linna HoffJames D Kindl, MD  lisinopril-hydrochlorothiazide (ZESTORETIC) 10-12.5 MG per tablet Take 1 tablet by mouth daily. 09/15/13   Reuben Likesavid C Keller, MD  lisinopril-hydrochlorothiazide (ZESTORETIC) 10-12.5 MG per tablet Take 1 tablet by mouth daily. 09/15/13   Reuben Likesavid C Keller, MD  mupirocin ointment (BACTROBAN) 2 % Apply 1 application topically 3 (three) times daily. 07/27/13   Reuben Likesavid C Keller, MD  mupirocin ointment (BACTROBAN) 2 % Apply to both nostrils TID for 1 month. 09/15/13   Reuben Likesavid C Keller, MD  ondansetron (ZOFRAN ODT) 4 MG disintegrating tablet Take 1 tablet (4 mg total) by mouth every 8 (eight) hours as needed for nausea or vomiting. 09/17/13   Riki SheerMichelle G Young, PA-C  penicillin v potassium (VEETID) 500 MG tablet Take 1 tablet (500 mg total) by mouth 3 (three) times daily. 07/18/12   Phill MutterPeter S Dammen, PA-C  salicylic acid-lactic acid 17 % external solution Apply topically daily. 09/15/13   Reuben Likesavid C Keller, MD  sulfamethoxazole-trimethoprim (BACTRIM DS) 800-160 MG per tablet Take 2 tablets by mouth 2 (two) times daily. 09/15/13   Reuben Likesavid C Keller, MD  sulfamethoxazole-trimethoprim (BACTRIM DS) 800-160 MG per tablet Take 2 tablets by mouth 2 (two) times daily. 09/15/13   Reuben Likesavid C Keller, MD   BP 145/84  Pulse 76  Temp(Src) 98.3 F (36.8 C) (Oral)  Resp 16  SpO2 98%  LMP 01/15/2014 Physical Exam  Constitutional: She is oriented to person, place, and time. She appears well-developed and well-nourished. No distress.  HENT:  Head: Normocephalic and atraumatic.  Right Ear: External ear normal.  Left Ear: External ear normal.  Nose: Nose normal.  Mouth/Throat: Oropharynx is clear and moist. Dental caries present.  Widespread dental carries without clinical evidence of gumline abscess or periapical abscess  Eyes: Conjunctivae are normal. No scleral icterus.  Cardiovascular: Normal rate.   Pulmonary/Chest: Effort normal.  Musculoskeletal: Normal range of motion.  Neurological:  She is alert and oriented to person, place, and time.  Skin: Skin is warm and dry. No rash noted. No erythema.  Psychiatric: She has a normal mood and affect. Her behavior is normal.    ED Course  Procedures (including critical care time) Labs Review Labs Reviewed - No data to display  Imaging Review No results found.   MDM   1. Dental caries   2. Abnormal vaginal bleeding    PCP or GYN follow up for irregular vaginal bleeding. Follow up with dentist of choice. Provided patient printed information regarding upcoming [facility] in Aug. 2015.    Jess BartersJennifer Lee SuringPresson, GeorgiaPA 01/21/14 [phone]

## 2014-01-21 NOTE — Discharge Instructions (Signed)
Dental Caries  Dental caries (also called tooth decay) is the most common oral disease. It can occur at any age, but is more common in children and young adults.  HOW DENTAL CARIES DEVELOPS  The process of decay begins when bacteria and foods (particularly sugars and starches) combine in your mouth to produce plaque. Plaque is a substance that sticks to the hard, outer surface of a tooth (enamel). The bacteria in plaque produce acids that attack enamel. These acids may also attack the root surface of a tooth (cementum) if it is exposed. Repeated attacks dissolve these surfaces and create holes in the tooth (cavities). If left untreated, the acids destroy the other layers of the tooth.  RISK FACTORS  Frequent sipping of sugary beverages.   Frequent snacking on sugary and starchy foods, especially those that easily get stuck in the teeth.   Poor oral hygiene.   Dry mouth.   Substance abuse such as methamphetamine abuse.   Broken or poor-fitting dental restorations.   Eating disorders.   Gastroesophageal reflux disease (GERD).   Certain radiation treatments to the head and neck. SYMPTOMS In the early stages of dental caries, symptoms are seldom present. Sometimes white, chalky areas may be seen on the enamel or other tooth layers. In later stages, symptoms may include:  Pits and holes on the enamel.  Toothache after sweet, hot, or cold foods or drinks are consumed.  Pain around the tooth.  Swelling around the tooth. DIAGNOSIS  Most of the time, dental caries is detected during a regular dental checkup. A diagnosis is made after a thorough medical and dental history is taken and the surfaces of your teeth are checked for signs of dental caries. Sometimes special instruments, such as lasers, are used to check for dental caries. Dental X-ray exams may be taken so that areas not visible to the eye (such as between the contact areas of the teeth) can be checked for cavities.    TREATMENT  If dental caries is in its early stages, it may be reversed with a fluoride treatment or an application of a remineralizing agent at the dental office. Thorough brushing and flossing at home is needed to aid these treatments. If it is in its later stages, treatment depends on the location and extent of tooth destruction:   If a small area of the tooth has been destroyed, the destroyed area will be removed and cavities will be filled with a material such as gold, silver amalgam, or composite resin.   If a large area of the tooth has been destroyed, the destroyed area will be removed and a cap (crown) will be fitted over the remaining tooth structure.   If the center part of the tooth (pulp) is affected, a procedure called a root canal will be needed before a filling or crown can be placed.   If most of the tooth has been destroyed, the tooth may need to be pulled (extracted). HOME CARE INSTRUCTIONS You can prevent, stop, or reverse dental caries at home by practicing good oral hygiene. Good oral hygiene includes:  Thoroughly cleaning your teeth at least twice a day with a toothbrush and dental floss.   Using a fluoride toothpaste. A fluoride mouth rinse may also be used if recommended by your dentist or health care provider.   Restricting the amount of sugary and starchy foods and sugary liquids you consume.   Avoiding frequent snacking on these foods and sipping of these liquids.   Keeping regular visits  with a dentist for checkups and cleanings. PREVENTION   Practice good oral hygiene.  Consider a dental sealant. A dental sealant is a coating material that is applied by your dentist to the pits and grooves of teeth. The sealant prevents food from being trapped in them. It may protect the teeth for several years.  Ask about fluoride supplements if you live in a community without fluorinated water or with water that has a low fluoride content. Use fluoride supplements  as directed by your dentist or health care provider.  Allow fluoride varnish applications to teeth if directed by your dentist or health care provider. Document Released: 05/18/2002 Document Revised: 04/28/2013 Document Reviewed: 08/28/2012 [facility] Patient Information 2014 [place], [place].  Abnormal Uterine Bleeding Abnormal uterine bleeding means bleeding from the vagina that is not your normal menstrual period. This can be:  Bleeding or spotting between periods.  Bleeding after sex (sexual intercourse).  Bleeding that is heavier or more than normal.  Periods that last longer than usual.  Bleeding after menopause. There are many problems that may cause this. Treatment will depend on the cause of the bleeding. Any kind of bleeding that is not normal should be reviewed by your doctor.  HOME CARE Watch your condition for any changes. These actions may lessen any discomfort you are having:  Do not use tampons or douches as told by your doctor.  Change your pads often. You should get regular pelvic exams and Pap tests. Keep all appointments for tests as told by your doctor. GET HELP IF:  You are bleeding for more than 1 week.  You feel dizzy at times. GET HELP RIGHT AWAY IF:   You pass out.  You have to change pads every 15 to 30 minutes.  You have belly pain.  You have a fever.  You become sweaty or weak.  You are passing large blood clots from the vagina.  You feel sick to your stomach (nauseous) and throw up (vomit). MAKE SURE YOU:  Understand these instructions.  Will watch your condition.  Will get help right away if you are not doing well or get worse. Document Released: 06/23/2009 Document Revised: 06/16/2013 Document Reviewed: 03/25/2013 [facility] Patient Information 2014 [place], [place].

## 2014-01-21 NOTE — ED Notes (Signed)
C/o dental pain and irregular cycle States left top tooth is broken Feels as an abscess is growing   Also states she wants birth control pills to help regulate cycle Ibuprofen, tylenol and saltwater was used as tx

## 2014-01-23 NOTE — ED Provider Notes (Signed)
Medical screening examination/treatment/procedure(s) were performed by resident physician or non-physician practitioner and as supervising physician I was immediately available for consultation/collaboration.   Rekia Kujala DOUGLAS MD.   Ondra Deboard D Gradyn Shein, MD 01/23/14 1931 

## 2014-08-31 ENCOUNTER — Encounter (HOSPITAL_COMMUNITY): Payer: Self-pay

## 2014-08-31 ENCOUNTER — Emergency Department (INDEPENDENT_AMBULATORY_CARE_PROVIDER_SITE_OTHER)
Admission: EM | Admit: 2014-08-31 | Discharge: 2014-08-31 | Disposition: A | Discharge status: Home / Self Care | Payer: Self-pay | Source: Home / Self Care | Attending: Emergency Medicine | Admitting: Emergency Medicine

## 2014-08-31 DIAGNOSIS — R079 Chest pain, unspecified: Secondary | ICD-10-CM

## 2014-08-31 DIAGNOSIS — K047 Periapical abscess without sinus: Secondary | ICD-10-CM

## 2014-08-31 MED ORDER — AMOXICILLIN 500 MG PO CAPS: 500.0000 mg | ORAL_CAPSULE | Freq: Three times a day (TID) | ORAL | Status: DC

## 2014-08-31 MED ORDER — OXYCODONE-ACETAMINOPHEN 5-325 MG PO TABS: ORAL_TABLET | ORAL | Status: DC

## 2014-08-31 MED ORDER — IBUPROFEN 800 MG PO TABS
800.0000 mg | ORAL_TABLET | Freq: Once | ORAL | Status: AC
  Administered 2014-08-31: 800 mg via ORAL

## 2014-08-31 MED ORDER — NAPROXEN 500 MG PO TABS: 500.0000 mg | ORAL_TABLET | Freq: Two times a day (BID) | ORAL | Status: DC

## 2014-08-31 MED ORDER — IBUPROFEN 800 MG PO TABS
ORAL_TABLET | ORAL | Status: AC
  Filled 2014-08-31: qty 1

## 2014-08-31 MED ORDER — NITROGLYCERIN 0.4 MG SL SUBL: 0.4000 mg | SUBLINGUAL_TABLET | SUBLINGUAL | Status: DC | PRN

## 2014-08-31 NOTE — Discharge Instructions (Signed)
Look up the [facility] [employer]'s [facility] for free dental clinics. [email or web address]  Get there early and be prepared to wait. [employer] and [employer] have Armed forces operational officerdental hygienist schools that provide low cost routine dental care.   Other resources: [facility] [address] [place], [place] [phone]  Patients with Medicaid: [facility]                     [facility] [phone]                                [phone] Phone:  [phone]                                                  Phone:  [phone]  Dr. Renee RivalJanice Civils [address] [phone]  If unable to pay or uninsured, contact:  [facility] or [facility] to become qualified for the adult dental clinic.  No matter what dental problem you have, it will not get better unless you get good dental care.  If the tooth is not taken care of, your symptoms will come back in time and you will be visiting us again in the Urgent Care Center with a bad toothache.  So, see your dentist as soon as possible.  If you don't have a dentist, we can give you a list of dentists.  Sometimes the most cost effective treatment is removal of the tooth.  This can be done very inexpensively through one of the low cost [facility] Centers such as the facility on [address] in [place] [phone]).  The downside to this is that you will have one less tooth and this can effect your ability to chew.  Some other things that can be done for a dental infection include the following:   Rinse your mouth out with hot salt water (1/2 tsp of table salt and a pinch of baking soda in 8 oz of hot water).  You can do this every 2 or 3 hours.  Avoid cold foods, beverages, and cold air.  This will make your symptoms worse.  Sleep with your head elevated.  Sleeping flat will cause your gums and oral tissues to swell and make them hurt  more.  You can sleep on several pillows.  Even better is to sleep in a recliner with your head higher than your heart.  For mild to moderate pain, you can take Tylenol, ibuprofen, or Aleve.  External application of heat by a heating pad, hot water bottle, or hot wet towel can help with pain and speed healing.  You can do this every 2 to 3 hours. Do not fall asleep on a heating pad since this can cause a burn.    Follow up with cardiologist as soon as possible.  If chest pain lasts longer than 15 minutes and is unrelieved by 3 nitroglycerines, call 911 and go to emergency room.  Take a baby aspirin daily.

## 2014-08-31 NOTE — ED Notes (Signed)
Know to have several teeth that need to come out. C/o left sided facial pain and swelling

## 2014-08-31 NOTE — ED Provider Notes (Signed)
Chief Complaint   Dental Pain   History of Present Illness   Mindy [name] is a 38 year old female who presents with a two-day history of pain and left upper first premolar. This tooth is cracked and broken. She has widespread dental decay, and her dentist is advised her to have all of her teeth pulled and dentures made. There is swelling over gingiva and maxilla. It hurts to open her mouth and to chew, and also to swallow. She denies any fever or chills. She's had some headache. She's had no trouble breathing. She has some swelling in her neck and pain.  In going through her view of systems she also mentioned that she has had chest pain for the past 2 days. This is nonexertional. It's located in the left pectoral area with radiation through the back. It's non-pleuritic. She denies any nausea, diaphoresis, or shortness of breath. No known cardiac history.  Review of Systems   Other than as noted above, the patient denies any of the following symptoms: Systemic:  No fever or chills. ENT:  No headache, ear ache, sore throat, nasal congestion, facial pain, or swelling. Neck:  No adenopathy or neck swelling. Lungs:  No coughing or shortness of breath.  PMFSH   Past medical history, family history, social history, meds, and allergies were reviewed. She is allergic to morphine and latex. She takes lisinopril for high blood pressure and is cigarette smoker.  Physical Examination     Vital signs:  BP 133/90 mmHg  Pulse 95  Temp(Src) 98.5 F (36.9 C) (Oral)  SpO2 97% General:  Alert, oriented, in no distress. ENT:  TMs and canals normal.  Nasal mucosa normal. Mouth exam:  She has widespread dental decay. Her left, upper, first premolar was particularly decayed and tender to touch. There is gingival swelling, but no collection of pus. No swelling of the floor the mouth of the tongue. The pharynx is clear. Neck:  No swelling or adenopathy. Lungs:  Breath sounds clear and equal  bilaterally.  No wheezes, rales or rhonchi. Heart:  Regular rhythm.  No gallops or murmers. Skin:  Clear, warm and dry.   EKG Results:  Date: 08/31/2014  Rate: 85  Rhythm: normal sinus rhythm  QRS Axis: normal--41  Intervals: normal  ST/T Wave abnormalities: normal  Conduction Disutrbances:none  Narrative Interpretation: Normal sinus rhythm, normal EKG  Old EKG Reviewed: none available   Course in Urgent Care Center   The following medications were given:  Medications  ibuprofen (ADVIL,MOTRIN) tablet 800 mg (800 mg Oral Given 08/31/14 1651)   Assessment   The primary encounter diagnosis was Dental infection. A diagnosis of Chest pain, unspecified chest pain type was also pertinent to this visit.  No evidence of Ludwig's angina.    Gave the patient the option of either going to the emergency room tonight for evaluation of the chest pain or following up with cardiology. Since she has no chest pain right now, she is inclined to follow-up as an outpatient.  Plan   1.  Meds:  The following meds were prescribed:   Discharge Medication List as of 08/31/2014  4:41 PM    START taking these medications   Details  amoxicillin (AMOXIL) 500 MG capsule Take 1 capsule (500 mg total) by mouth 3 (three) times daily., Starting 08/31/2014, Until Discontinued, Normal    naproxen (NAPROSYN) 500 MG tablet Take 1 tablet (500 mg total) by mouth 2 (two) times daily., Starting 08/31/2014, Until Discontinued, Normal  nitroGLYCERIN (NITROSTAT) 0.4 MG SL tablet Place 1 tablet (0.4 mg total) under the tongue every 5 (five) minutes as needed for chest pain., Starting 08/31/2014, Until Discontinued, Normal    oxyCODONE-acetaminophen (PERCOCET) 5-325 MG per tablet 1 to 2 tablets every 6 hours as needed for pain., Print        2.  Patient Education/Counseling:  The patient was given appropriate handouts, self care instructions, and instructed in pain control. Suggested sleeping with head of bed  elevated and hot salt water mouthwash. This suggested she take a baby aspirin a day and nitroglycerin for future episodes of chest pain. If the pain hasn't gone away after 3 nitroglycerin and 15 minutes, should go by EMS to the emergency room. Follow up as soon as possible with cardiology.  3.  Follow up:  The patient was told to follow up if no better in 3 to 4 days, if becoming worse in any way, and given some red flag symptoms such as difficulty swallowing or breathing which would prompt immediate return.  Follow up with a dentist as soon as posssible. [facility].     Reuben Likesavid C Magdeline Prange, MD 08/31/14 [phone]

## 2014-09-03 ENCOUNTER — Emergency Department (INDEPENDENT_AMBULATORY_CARE_PROVIDER_SITE_OTHER)
Admission: EM | Admit: 2014-09-03 | Discharge: 2014-09-03 | Disposition: A | Discharge status: Home / Self Care | Payer: Self-pay | Source: Home / Self Care | Attending: Family Medicine | Admitting: Family Medicine

## 2014-09-03 ENCOUNTER — Encounter (HOSPITAL_COMMUNITY): Payer: Self-pay | Admitting: Emergency Medicine

## 2014-09-03 DIAGNOSIS — K047 Periapical abscess without sinus: Secondary | ICD-10-CM

## 2014-09-03 NOTE — Discharge Instructions (Signed)
The area of infection is improving but will need more antibiotics in order to fully go away Pelase continue your antibiotics and call here if you are not better by the time you run out of antibiotics Please use your naprosyn ofor pain relief and swelling.

## 2014-09-03 NOTE — ED Notes (Signed)
Pt states that she was here 08/31/2014 for mouth abcess states she was given antibiotics and naproxen pt states that her abcess is now leaking in her mouth

## 2014-09-03 NOTE — ED Provider Notes (Signed)
CSN: [id number]     Arrival date & time 09/03/14  1814 History   None    Chief Complaint  Patient presents with  . Oral Pain   (Consider location/radiation/quality/duration/timing/severity/associated sxs/prior Treatment) HPI  Seen on 08/31/14 for oral abscess. GIven amoxicillin, naprosyn, and percocet for a dental infection. Since tha ttime pain has improved. Swelling diminished but localised to the top of the mouth. Discharge is a bloody brownish color. Deneis fevers, CP.   Past Medical History  Diagnosis Date  . Hypertension    Past Surgical History  Procedure Laterality Date  . C-sections    . Cholecystectomy    . Removal of axilla sweat glands     History reviewed. No pertinent family history. History  Substance Use Topics  . Smoking status: Current Every Day Smoker  . Smokeless tobacco: Not on file  . Alcohol Use: No   OB History    No data available     Review of Systems Per HPI with all other pertinent systems negative.   Allergies  Morphine and related and Latex  Home Medications   Prior to Admission medications   Medication Sig Start Date End Date Taking? Authorizing Provider  amoxicillin (AMOXIL) 500 MG capsule Take 1 capsule (500 mg total) by mouth 3 (three) times daily. 08/31/14   Reuben Likesavid C Keller, MD  HYDROcodone-acetaminophen (NORCO) 5-325 MG per tablet Take 1-2 tablets by mouth every 4 (four) hours as needed for pain. 07/18/12   Angus SellerPeter S Dammen, PA-C  HYDROcodone-acetaminophen (NORCO/VICODIN) 5-325 MG per tablet 1 to 2 tabs every 4 to 6 hours as needed for pain. 07/27/13   Reuben Likesavid C Keller, MD  HYDROcodone-acetaminophen (NORCO/VICODIN) 5-325 MG per tablet 1 to 2 tabs every 4 to 6 hours as needed for pain. 09/15/13   Reuben Likesavid C Keller, MD  ibuprofen (ADVIL,MOTRIN) 200 MG tablet Take 200 mg by mouth every 6 (six) hours as needed. For pain     Historical Provider, MD  lidocaine (XYLOCAINE) 2 % solution Apply to affected areas of pain with cotton swab Q3hrs prn.  01/21/14   Mathis FareJennifer Lee H Presson, PA  lisinopril-hydrochlorothiazide (PRINZIDE,ZESTORETIC) 10-12.5 MG per tablet Take 1 tablet by mouth daily. 06/11/13   Linna HoffJames D Kindl, MD  lisinopril-hydrochlorothiazide (ZESTORETIC) 10-12.5 MG per tablet Take 1 tablet by mouth daily. 09/15/13   Reuben Likesavid C Keller, MD  lisinopril-hydrochlorothiazide (ZESTORETIC) 10-12.5 MG per tablet Take 1 tablet by mouth daily. 09/15/13   Reuben Likesavid C Keller, MD  mupirocin ointment (BACTROBAN) 2 % Apply 1 application topically 3 (three) times daily. 07/27/13   Reuben Likesavid C Keller, MD  mupirocin ointment (BACTROBAN) 2 % Apply to both nostrils TID for 1 month. 09/15/13   Reuben Likesavid C Keller, MD  naproxen (NAPROSYN) 500 MG tablet Take 1 tablet (500 mg total) by mouth 2 (two) times daily. 08/31/14   Reuben Likesavid C Keller, MD  nitroGLYCERIN (NITROSTAT) 0.4 MG SL tablet Place 1 tablet (0.4 mg total) under the tongue every 5 (five) minutes as needed for chest pain. 08/31/14   Reuben Likesavid C Keller, MD  ondansetron (ZOFRAN ODT) 4 MG disintegrating tablet Take 1 tablet (4 mg total) by mouth every 8 (eight) hours as needed for nausea or vomiting. 09/17/13   Riki SheerMichelle G Young, PA-C  oxyCODONE-acetaminophen (PERCOCET) 5-325 MG per tablet 1 to 2 tablets every 6 hours as needed for pain. 08/31/14   Reuben Likesavid C Keller, MD  penicillin v potassium (VEETID) 500 MG tablet Take 1 tablet (500 mg total) by mouth 3 (three) times daily.  07/18/12   Phill MutterPeter S Dammen, PA-C  salicylic acid-lactic acid 17 % external solution Apply topically daily. 09/15/13   Reuben Likesavid C Keller, MD  sulfamethoxazole-trimethoprim (BACTRIM DS) 800-160 MG per tablet Take 2 tablets by mouth 2 (two) times daily. 09/15/13   Reuben Likesavid C Keller, MD  sulfamethoxazole-trimethoprim (BACTRIM DS) 800-160 MG per tablet Take 2 tablets by mouth 2 (two) times daily. 09/15/13   Reuben Likesavid C Keller, MD   BP 127/83 mmHg  Pulse 83  Temp(Src) 98.1 F (36.7 C) (Oral)  Resp 18  SpO2 97%  LMP 08/13/2014 Physical Exam  Constitutional: She appears well-developed  and well-nourished.  HENT:  Head: Normocephalic.  Hard palate swelling and area of fluctuance Numerous cavities  Eyes: EOM are normal. Pupils are equal, round, and reactive to light.  Neck: Normal range of motion.  Cardiovascular: Normal rate and regular rhythm.   Pulmonary/Chest: Effort normal and breath sounds normal.  Abdominal: Soft.  Musculoskeletal: Normal range of motion. She exhibits no edema or tenderness.  Neurological: She is alert.  Skin: Skin is warm and dry. No rash noted.  Psychiatric: She has a normal mood and affect.    ED Course  Procedures (including critical care time) Labs Review Labs Reviewed - No data to display  Imaging Review No results found.  After obtaining verbal consent an 18 g needle was used to puncture the area of flucutance w/ mostly bloody but scant purulent discharge.   MDM   1. Dental infection    Continue amox Continue warm fluids Call if not improved after finishing abx for refill  Precautions given and all questions answered  Shelly Flattenavid Merrell, MD Family Medicine 09/03/2014, 7:40 PM      Ozella Rocksavid J Merrell, MD 09/03/14 [phone]

## 2015-02-18 ENCOUNTER — Encounter (HOSPITAL_COMMUNITY): Payer: Self-pay

## 2015-02-18 ENCOUNTER — Emergency Department (INDEPENDENT_AMBULATORY_CARE_PROVIDER_SITE_OTHER)
Admission: EM | Admit: 2015-02-18 | Discharge: 2015-02-18 | Disposition: A | Discharge status: Home / Self Care | Payer: Self-pay | Source: Home / Self Care | Attending: Family Medicine | Admitting: Family Medicine

## 2015-02-18 DIAGNOSIS — I10 Essential (primary) hypertension: Secondary | ICD-10-CM

## 2015-02-18 DIAGNOSIS — K088 Other specified disorders of teeth and supporting structures: Secondary | ICD-10-CM

## 2015-02-18 DIAGNOSIS — K0889 Other specified disorders of teeth and supporting structures: Secondary | ICD-10-CM

## 2015-02-18 LAB — POCT I-STAT, CHEM 8
BUN: 6 mg/dL (ref 6–20)
CHLORIDE: 107 mmol/L (ref 101–111)
Calcium, Ion: 1.11 mmol/L — ABNORMAL LOW (ref 1.12–1.23)
Creatinine, Ser: 0.6 mg/dL (ref 0.44–1.00)
Glucose, Bld: 90 mg/dL (ref 65–99)
HCT: 40 % (ref 36.0–46.0)
HEMOGLOBIN: 13.6 g/dL (ref 12.0–15.0)
POTASSIUM: 3.2 mmol/L — AB (ref 3.5–5.1)
SODIUM: 142 mmol/L (ref 135–145)
TCO2: 21 mmol/L (ref 0–100)

## 2015-02-18 MED ORDER — LISINOPRIL-HYDROCHLOROTHIAZIDE 10-12.5 MG PO TABS: 1.0000 | ORAL_TABLET | Freq: Every day | ORAL | Status: DC

## 2015-02-18 MED ORDER — HYDROCODONE-ACETAMINOPHEN 5-325 MG PO TABS: 1.0000 | ORAL_TABLET | Freq: Once | ORAL | Status: DC

## 2015-02-18 MED ORDER — HYDROCODONE-ACETAMINOPHEN 5-325 MG PO TABS
ORAL_TABLET | ORAL | Status: AC
  Filled 2015-02-18: qty 1

## 2015-02-18 MED ORDER — HYDROCODONE-ACETAMINOPHEN 5-325 MG PO TABS
1.0000 | ORAL_TABLET | Freq: Once | ORAL | Status: AC
  Administered 2015-02-18: 1 via ORAL

## 2015-02-18 MED ORDER — HYDROCODONE-ACETAMINOPHEN 5-325 MG PO TABS: 1.0000 | ORAL_TABLET | Freq: Four times a day (QID) | ORAL | Status: DC | PRN

## 2015-02-18 MED ORDER — AMOXICILLIN 500 MG PO CAPS: 500.0000 mg | ORAL_CAPSULE | Freq: Three times a day (TID) | ORAL | Status: DC

## 2015-02-18 NOTE — Discharge Instructions (Signed)
Thank you for coming in today.  Please call or see [name] for assistance with your bill.  You may qualify for reduced or free services.  Her phone number is [phone]. Her email is [email or web address]  [facility] [address] [place], [place] [address] [phone]  Please attend the [facility] dental clinic in [place] July 29 to the 30th. You will never get better until you have your teeth removed  Restart your blood pressure medicine. Avoid becoming pregnant with taking this medication.   Dental Pain Toothache is pain in or around a tooth. It may get worse with chewing or with cold or heat.  HOME CARE  Your dentist may use a numbing medicine during treatment. If so, you may need to avoid eating until the medicine wears off. Ask your dentist about this.  Only take medicine as told by your dentist or doctor.  Avoid chewing food near the painful tooth until after all treatment is done. Ask your dentist about this. GET HELP RIGHT AWAY IF:   The problem gets worse or new problems appear.  You have a fever.  There is redness and puffiness (swelling) of the face, jaw, or neck.  You cannot open your mouth.  There is pain in the jaw.  There is very bad pain that is not helped by medicine. MAKE SURE YOU:   Understand these instructions.  Will watch your condition.  Will get help right away if you are not doing well or get worse. Document Released: 02/12/2008 Document Revised: 11/18/2011 Document Reviewed: 02/12/2008 [facility] Patient Information 2015 [place], [place]. This information is not intended to replace advice given to you by your health care provider. Make sure you discuss any questions you have with your health care provider.  Hypertension Hypertension, commonly called high blood pressure, is when the force of blood pumping through your arteries is too strong. Your  arteries are the blood vessels that carry blood from your heart throughout your body. A blood pressure reading consists of a higher number over a lower number, such as 110/72. The higher number (systolic) is the pressure inside your arteries when your heart pumps. The lower number (diastolic) is the pressure inside your arteries when your heart relaxes. Ideally you want your blood pressure below 120/80. Hypertension forces your heart to work harder to pump blood. Your arteries may become narrow or stiff. Having hypertension puts you at risk for heart disease, stroke, and other problems.  RISK FACTORS Some risk factors for high blood pressure are controllable. Others are not.  Risk factors you cannot control include:   Race. You may be at higher risk if you are African American.  Age. Risk increases with age.  Gender. Men are at higher risk than women before age 28 years. After age 63, women are at higher risk than men. Risk factors you can control include:  Not getting enough exercise or physical activity.  Being overweight.  Getting too much fat, sugar, calories, or salt in your diet.  Drinking too much alcohol. SIGNS AND SYMPTOMS Hypertension does not usually cause signs or symptoms. Extremely high blood pressure (hypertensive crisis) may cause headache, anxiety, shortness of breath, and nosebleed. DIAGNOSIS  To check if you have hypertension, your health care provider will measure your blood pressure while you are seated, with your arm held at the level of your heart. It should be measured at least twice using the same arm. Certain conditions can  cause a difference in blood pressure between your right and left arms. A blood pressure reading that is higher than normal on one occasion does not mean that you need treatment. If one blood pressure reading is high, ask your health care provider about having it checked again. TREATMENT  Treating high blood pressure includes making lifestyle  changes and possibly taking medicine. Living a healthy lifestyle can help lower high blood pressure. You may need to change some of your habits. Lifestyle changes may include:  Following the DASH diet. This diet is high in fruits, vegetables, and whole grains. It is low in salt, red meat, and added sugars.  Getting at least 2 hours of brisk physical activity every week.  Losing weight if necessary.  Not smoking.  Limiting alcoholic beverages.  Learning ways to reduce stress. If lifestyle changes are not enough to get your blood pressure under control, your health care provider may prescribe medicine. You may need to take more than one. Work closely with your health care provider to understand the risks and benefits. HOME CARE INSTRUCTIONS  Have your blood pressure rechecked as directed by your health care provider.   Take medicines only as directed by your health care provider. Follow the directions carefully. Blood pressure medicines must be taken as prescribed. The medicine does not work as well when you skip doses. Skipping doses also puts you at risk for problems.   Do not smoke.   Monitor your blood pressure at home as directed by your health care provider. SEEK MEDICAL CARE IF:   You think you are having a reaction to medicines taken.  You have recurrent headaches or feel dizzy.  You have swelling in your ankles.  You have trouble with your vision. SEEK IMMEDIATE MEDICAL CARE IF:  You develop a severe headache or confusion.  You have unusual weakness, numbness, or feel faint.  You have severe chest or abdominal pain.  You vomit repeatedly.  You have trouble breathing. MAKE SURE YOU:   Understand these instructions.  Will watch your condition.  Will get help right away if you are not doing well or get worse. Document Released: 08/26/2005 Document Revised: 01/10/2014 Document Reviewed: 06/18/2013 [facility] Patient Information 2015 [place], [place]. This  information is not intended to replace advice given to you by your health care provider. Make sure you discuss any questions you have with your health care provider.

## 2015-02-18 NOTE — ED Notes (Signed)
Left facial pain x 3 days.

## 2015-02-18 NOTE — ED Provider Notes (Signed)
Mindy [name] is a 39 y.o. female who presents to Urgent Care today for left-sided facial pain. Patient has severe left facial pain for the past 3 days. She notes poor dentition. She's had multiple dental abscesses and infections. She's tried multiple over-the-counter pain medicines which have not helped. No fevers or chills.  Elevated blood pressure. Patient does not have health insurance and has been unable to follow-up with a doctor. She ran out of her blood pressures medicine some time ago. No chest pains or palpitations.   Past Medical History  Diagnosis Date  . Hypertension    Past Surgical History  Procedure Laterality Date  . C-sections    . Cholecystectomy    . Removal of axilla sweat glands     History  Substance Use Topics  . Smoking status: Current Every Day Smoker  . Smokeless tobacco: Not on file  . Alcohol Use: No   ROS as above Medications: No current facility-administered medications for this encounter.   Current Outpatient Prescriptions  Medication Sig Dispense Refill  . amoxicillin (AMOXIL) 500 MG capsule Take 1 capsule (500 mg total) by mouth 3 (three) times daily. 30 capsule 0  . HYDROcodone-acetaminophen (NORCO) 5-325 MG per tablet Take 1-2 tablets by mouth every 4 (four) hours as needed for pain. 20 tablet 0  . HYDROcodone-acetaminophen (NORCO/VICODIN) 5-325 MG per tablet 1 to 2 tabs every 4 to 6 hours as needed for pain. 20 tablet 0  . HYDROcodone-acetaminophen (NORCO/VICODIN) 5-325 MG per tablet 1 to 2 tabs every 4 to 6 hours as needed for pain. 20 tablet 0  . ibuprofen (ADVIL,MOTRIN) 200 MG tablet Take 200 mg by mouth every 6 (six) hours as needed. For pain     . lidocaine (XYLOCAINE) 2 % solution Apply to affected areas of pain with cotton swab Q3hrs prn. 100 mL 0  . lisinopril-hydrochlorothiazide (PRINZIDE,ZESTORETIC) 10-12.5 MG per tablet Take 1 tablet by mouth daily. 30 tablet 0  . lisinopril-hydrochlorothiazide (ZESTORETIC) 10-12.5 MG per tablet  Take 1 tablet by mouth daily. 30 tablet 5  . lisinopril-hydrochlorothiazide (ZESTORETIC) 10-12.5 MG per tablet Take 1 tablet by mouth daily. 30 tablet 5  . mupirocin ointment (BACTROBAN) 2 % Apply 1 application topically 3 (three) times daily. 22 g 0  . mupirocin ointment (BACTROBAN) 2 % Apply to both nostrils TID for 1 month. 22 g 0  . naproxen (NAPROSYN) 500 MG tablet Take 1 tablet (500 mg total) by mouth 2 (two) times daily. 30 tablet 0  . nitroGLYCERIN (NITROSTAT) 0.4 MG SL tablet Place 1 tablet (0.4 mg total) under the tongue every 5 (five) minutes as needed for chest pain. 30 tablet 12  . ondansetron (ZOFRAN ODT) 4 MG disintegrating tablet Take 1 tablet (4 mg total) by mouth every 8 (eight) hours as needed for nausea or vomiting. 20 tablet 0  . oxyCODONE-acetaminophen (PERCOCET) 5-325 MG per tablet 1 to 2 tablets every 6 hours as needed for pain. 20 tablet 0  . penicillin v potassium (VEETID) 500 MG tablet Take 1 tablet (500 mg total) by mouth 3 (three) times daily. 30 tablet 0  . salicylic acid-lactic acid 17 % external solution Apply topically daily. 14 mL 0  . sulfamethoxazole-trimethoprim (BACTRIM DS) 800-160 MG per tablet Take 2 tablets by mouth 2 (two) times daily. 40 tablet 0  . sulfamethoxazole-trimethoprim (BACTRIM DS) 800-160 MG per tablet Take 2 tablets by mouth 2 (two) times daily. 40 tablet 0   Allergies  Allergen Reactions  . Morphine And Related  Itching  . Latex Rash     Exam:  BP 202/111 mmHg  Pulse 88  Temp(Src) 98.8 F (37.1 C) (Oral)  Resp 16  SpO2 96%  LMP 02/01/2015 (Exact Date) Gen: Well NAD HEENT: EOMI,  MMM poor dentition throughout tender to palpation left lower we're molar.  Lungs: Normal work of breathing. CTABL Heart: RRR no MRG Abd: NABS, Soft. Nondistended, Nontender Exts: Brisk capillary refill, warm and well perfused.   Patient was given one hydrocodone tablet prior to discharge  Results for orders placed or performed during the hospital  encounter of 02/18/15 (from the past 24 hour(s))  I-STAT, chem 8     Status: Abnormal   Collection Time: 02/18/15  3:24 PM  Result Value Ref Range   Sodium 142 135 - 145 mmol/L   Potassium 3.2 (L) 3.5 - 5.1 mmol/L   Chloride 107 101 - 111 mmol/L   BUN 6 6 - 20 mg/dL   Creatinine, Ser 1.55 0.44 - 1.00 mg/dL   Glucose, Bld 90 65 - 99 mg/dL   Calcium, Ion 2.08 (L) 1.12 - 1.23 mmol/L   TCO2 21 0 - 100 mmol/L   Hemoglobin 13.6 12.0 - 15.0 g/dL   HCT 02.2 33.6 - 12.2 %   No results found.  Assessment and Plan: 39 y.o. female with  1) dental pain. Treat with amoxicillin and hydrocodone. Follow[facility]up with [facility] 2) hypertension: Refill lisinopril hydrochlorothiazide. Follow up with community health and wellness clinic    Discussed warning signs or symptoms. Please see discharge instructions. Patient expresses understanding.     Rodolph Bong, MD 02/18/15 [phone]

## 2016-03-06 ENCOUNTER — Emergency Department (HOSPITAL_COMMUNITY)
Admission: EM | Admit: 2016-03-06 | Discharge: 2016-03-06 | Disposition: A | Discharge status: Home / Self Care | Payer: Self-pay | Attending: Emergency Medicine | Admitting: Emergency Medicine

## 2016-03-06 ENCOUNTER — Encounter (HOSPITAL_COMMUNITY): Payer: Self-pay | Admitting: Emergency Medicine

## 2016-03-06 DIAGNOSIS — IMO0002 Reserved for concepts with insufficient information to code with codable children: Secondary | ICD-10-CM

## 2016-03-06 DIAGNOSIS — Y999 Unspecified external cause status: Secondary | ICD-10-CM | POA: Insufficient documentation

## 2016-03-06 DIAGNOSIS — Y929 Unspecified place or not applicable: Secondary | ICD-10-CM | POA: Insufficient documentation

## 2016-03-06 DIAGNOSIS — Y939 Activity, unspecified: Secondary | ICD-10-CM | POA: Insufficient documentation

## 2016-03-06 DIAGNOSIS — W268XXA Contact with other sharp object(s), not elsewhere classified, initial encounter: Secondary | ICD-10-CM | POA: Insufficient documentation

## 2016-03-06 DIAGNOSIS — F17210 Nicotine dependence, cigarettes, uncomplicated: Secondary | ICD-10-CM | POA: Insufficient documentation

## 2016-03-06 DIAGNOSIS — I10 Essential (primary) hypertension: Secondary | ICD-10-CM | POA: Insufficient documentation

## 2016-03-06 DIAGNOSIS — S61215A Laceration without foreign body of left ring finger without damage to nail, initial encounter: Secondary | ICD-10-CM | POA: Insufficient documentation

## 2016-03-06 MED ORDER — TETANUS-DIPHTH-ACELL PERTUSSIS 5-2.5-18.5 LF-MCG/0.5 IM SUSP
0.5000 mL | Freq: Once | INTRAMUSCULAR | Status: AC
  Administered 2016-03-06: 0.5 mL via INTRAMUSCULAR
  Filled 2016-03-06: qty 0.5

## 2016-03-06 NOTE — Discharge Instructions (Signed)
Your tetanus was updated in the ED today. Please keep your wound clean and dry. Follow-up with your doctor as needed. Return to ED for new or worsening symptoms as we discussed.

## 2016-03-06 NOTE — ED Notes (Signed)
Patient states she was sweeping with an aluminium broom and cut her left ring finger. Laceration is apporx. 1 inch long. Bleeding controlled. Patient is alert and oriented.

## 2016-03-06 NOTE — ED Provider Notes (Signed)
CSN: [id number]     Arrival date & time 03/06/16  1737 History   By signing my name below, I, Evon Slackerrance Branch, attest that this documentation has been prepared under the direction and in the presence of General MillsBenjamin Brock Mokry, PA-C. Electronically Signed: Evon Slackerrance Branch, ED Scribe. 03/06/2016. 6:54 PM.      Chief Complaint  Patient presents with  . Laceration   The history is provided by the patient. No language interpreter was used.   HPI Comments: Mindy [name] is a 40 y.o. female who presents to the Emergency Department complaining of left 4th digit laceration onset today at 5 PM. Pt states that the bleeding is controlled. Pt states that she was sweeping with an aluminum broom and the broom cracked cutting her finger. Pt states that she did clean the wound PTA. Pt states that her tetanus is not UTD.  Past Medical History  Diagnosis Date  . Hypertension    Past Surgical History  Procedure Laterality Date  . C-sections    . Cholecystectomy    . Removal of axilla sweat glands     No family history on file. Social History  Substance Use Topics  . Smoking status: Current Every Day Smoker -- 0.50 packs/day    Types: Cigarettes  . Smokeless tobacco: None  . Alcohol Use: No   OB History    No data available     Review of Systems  Skin: Positive for wound.   A complete 10 system review of systems was obtained and all systems are negative except as noted in the HPI and PMH.     Allergies  Morphine and related and Latex  Home Medications   Prior to Admission medications   Medication Sig Start Date End Date Taking? Authorizing Provider  amoxicillin (AMOXIL) 500 MG capsule Take 1 capsule (500 mg total) by mouth 3 (three) times daily. 02/18/15   Rodolph BongEvan S Corey, MD  HYDROcodone-acetaminophen (NORCO/VICODIN) 5-325 MG per tablet Take 1 tablet by mouth every 6 (six) hours as needed. 02/18/15   Rodolph BongEvan S Corey, MD  lisinopril-hydrochlorothiazide (PRINZIDE,ZESTORETIC) 10-12.5 MG per tablet  Take 1 tablet by mouth daily. 02/18/15   Rodolph BongEvan S Corey, MD   BP 141/92 mmHg  Pulse 91  Temp(Src) 98.9 F (37.2 C) (Oral)  Resp 21  Ht 5\' 6"  (1.676 m)  Wt 95.255 kg  BMI 33.91 kg/m2  SpO2 98%  LMP 02/07/2016   Physical Exam  Constitutional: She is oriented to person, place, and time. She appears well-developed and well-nourished. No distress.  HENT:  Head: Normocephalic and atraumatic.  Eyes: Conjunctivae and EOM are normal.  Neck: Neck supple.  Cardiovascular: Normal rate and normal heart sounds.   Pulmonary/Chest: Effort normal. No respiratory distress.  Abdominal: Soft. There is no tenderness.  Musculoskeletal: Normal range of motion.  Neurological: She is alert and oriented to person, place, and time.  Skin: Skin is warm and dry.  superficial oblique laceration ring finger left hand on distal tip approximately 1 cm.    Psychiatric: She has a normal mood and affect. Her behavior is normal.  Nursing note and vitals reviewed.   ED Course  Procedures (including critical care time) DIAGNOSTIC STUDIES: Oxygen Saturation is 98% on RA, normal by my interpretation.    COORDINATION OF CARE: 6:53 PM-Discussed treatment plan which includes wound repair with pt at bedside and pt agreed to plan.    LACERATION REPAIR Performed by: Sharlene Mottsartner, Kanai Hilger W Authorized by: Sharlene Mottsartner, Tayari Yankee W Consent: Verbal consent obtained. Risks  and benefits: risks, benefits and alternatives were discussed Consent given by: patient Patient identity confirmed: provided demographic data Prepped and Draped in normal sterile fashion Wound explored  Laceration Location: Distal left ring finger  Laceration Length: 1 cm  No Foreign Bodies seen or palpated  Irrigation method: syringe Amount of cleaning: standard  Skin closure: Dermabond   Patient tolerance: Patient tolerated the procedure well with no immediate complications. Labs Review Labs Reviewed - No data to display  Imaging Review No  results found.    EKG Interpretation None      MDM  Patient with very superficial laceration. Full active range of motion and neurovascularly intact, not over joint lines. Tetanus updated in ED. Laceration occurred < 12 hours prior to repair. Discussed laceration care with pt and answered questions. Pt is hemodynamically stable with no complaints prior to dc.  Return precautions discussed.  Final diagnoses:  Laceration      I personally performed the services described in this documentation, which was scribed in my presence. The recorded information has been reviewed and is accurate.      Joycie PeekBenjamin Jeris Roser, PA-C 03/06/16 2003  Derwood KaplanAnkit Nanavati, MD 03/07/16 [phone]

## 2017-05-20 ENCOUNTER — Encounter (HOSPITAL_COMMUNITY): Payer: Self-pay | Admitting: Nurse Practitioner

## 2017-05-20 ENCOUNTER — Other Ambulatory Visit (HOSPITAL_COMMUNITY): Payer: Self-pay | Admitting: Nurse Practitioner

## 2017-05-20 ENCOUNTER — Ambulatory Visit (HOSPITAL_COMMUNITY)
Admission: RE | Admit: 2017-05-20 | Discharge: 2017-05-20 | Disposition: A | Discharge status: Home / Self Care | Payer: Medicaid Other | Source: Ambulatory Visit | Attending: Nurse Practitioner | Admitting: Nurse Practitioner

## 2017-05-20 DIAGNOSIS — R079 Chest pain, unspecified: Secondary | ICD-10-CM

## 2017-06-10 ENCOUNTER — Other Ambulatory Visit: Payer: Self-pay | Admitting: Nurse Practitioner

## 2017-06-10 DIAGNOSIS — Z1231 Encounter for screening mammogram for malignant neoplasm of breast: Secondary | ICD-10-CM

## 2017-06-20 ENCOUNTER — Inpatient Hospital Stay: Admission: RE | Admit: 2017-06-20 | Payer: Medicaid Other | Source: Ambulatory Visit

## 2018-05-28 ENCOUNTER — Ambulatory Visit (INDEPENDENT_AMBULATORY_CARE_PROVIDER_SITE_OTHER): Payer: Medicaid Other

## 2018-05-28 ENCOUNTER — Encounter (HOSPITAL_COMMUNITY): Payer: Self-pay | Admitting: Emergency Medicine

## 2018-05-28 ENCOUNTER — Ambulatory Visit (HOSPITAL_COMMUNITY)
Admission: EM | Admit: 2018-05-28 | Discharge: 2018-05-28 | Disposition: A | Discharge status: Home / Self Care | Payer: Medicaid Other | Attending: Family Medicine | Admitting: Family Medicine

## 2018-05-28 DIAGNOSIS — R05 Cough: Secondary | ICD-10-CM

## 2018-05-28 DIAGNOSIS — R062 Wheezing: Secondary | ICD-10-CM

## 2018-05-28 DIAGNOSIS — J22 Unspecified acute lower respiratory infection: Secondary | ICD-10-CM | POA: Diagnosis not present

## 2018-05-28 MED ORDER — AZITHROMYCIN 250 MG PO TABS: 250.0000 mg | ORAL_TABLET | Freq: Every day | ORAL | 0 refills | Status: DC

## 2018-05-28 MED ORDER — ALBUTEROL SULFATE HFA 108 (90 BASE) MCG/ACT IN AERS
INHALATION_SPRAY | RESPIRATORY_TRACT | Status: AC
  Filled 2018-05-28: qty 6.7

## 2018-05-28 MED ORDER — IPRATROPIUM-ALBUTEROL 0.5-2.5 (3) MG/3ML IN SOLN
3.0000 mL | Freq: Once | RESPIRATORY_TRACT | Status: AC
  Administered 2018-05-28: 3 mL via RESPIRATORY_TRACT

## 2018-05-28 MED ORDER — ALBUTEROL SULFATE HFA 108 (90 BASE) MCG/ACT IN AERS
2.0000 | INHALATION_SPRAY | Freq: Once | RESPIRATORY_TRACT | Status: AC
  Administered 2018-05-28: 2 via RESPIRATORY_TRACT

## 2018-05-28 MED ORDER — BENZONATATE 100 MG PO CAPS: 100.0000 mg | ORAL_CAPSULE | Freq: Three times a day (TID) | ORAL | 0 refills | Status: DC

## 2018-05-28 MED ORDER — IPRATROPIUM-ALBUTEROL 0.5-2.5 (3) MG/3ML IN SOLN
RESPIRATORY_TRACT | Status: AC
  Filled 2018-05-28: qty 3

## 2018-05-28 NOTE — ED Provider Notes (Signed)
[facility]   [id number] 05/28/18 Arrival Time: 1539   CC: Cough  SUBJECTIVE: History from: patient.  Mindy [name] is a 42 y.o. female hx significant for HTN and tobacco use, who presents with abrupt onset of cough x 4 days.  Admits to positive sick exposure.  Describes cough as constant and dry.  Has tried nyquil with relief.  Symptoms are made worse with laying down at night.  Reports previous symptoms in the past and diagnosed with bronchitis.  Complains of chills, fatigue, HA, rhinorrhea, chest congestion, chest tightness, SOB, and wheezing.   Denies fever, sinus pain, sore throat, chest pain, nausea, changes in bowel or bladder habits.    ROS: As per HPI.  Past Medical History:  Diagnosis Date  . Hypertension    Past Surgical History:  Procedure Laterality Date  . c-sections    . CHOLECYSTECTOMY    . removal of axilla sweat glands     Allergies  Allergen Reactions  . Morphine And Related Itching  . Latex Rash   No current facility-administered medications on file prior to encounter.    Current Outpatient Medications on File Prior to Encounter  Medication Sig Dispense Refill  . amoxicillin (AMOXIL) 500 MG capsule Take 1 capsule (500 mg total) by mouth 3 (three) times daily. (Patient not taking: Reported on 05/28/2018) 21 capsule 0  . HYDROcodone-acetaminophen (NORCO/VICODIN) 5-325 MG per tablet Take 1 tablet by mouth every 6 (six) hours as needed. (Patient not taking: Reported on 05/28/2018) 15 tablet 0  . lisinopril-hydrochlorothiazide (PRINZIDE,ZESTORETIC) 10-12.5 MG per tablet Take 1 tablet by mouth daily. 30 tablet 0  . [DISCONTINUED] nitroGLYCERIN (NITROSTAT) 0.4 MG SL tablet Place 1 tablet (0.4 mg total) under the tongue every 5 (five) minutes as needed for chest pain. 30 tablet 12   Social History   Socioeconomic History  . Marital status: Married    Spouse name: Not on file  . Number of children: Not on file  . Years of education: Not on file  .  Highest education level: Not on file  Occupational History  . Not on file  Social Needs  . Financial resource strain: Not on file  . Food insecurity:    Worry: Not on file    Inability: Not on file  . Transportation needs:    Medical: Not on file    Non-medical: Not on file  Tobacco Use  . Smoking status: Current Every Day Smoker    Packs/day: 0.50    Types: Cigarettes  Substance and Sexual Activity  . Alcohol use: No  . Drug use: No  . Sexual activity: Yes    Birth control/protection: Surgical  Lifestyle  . Physical activity:    Days per week: Not on file    Minutes per session: Not on file  . Stress: Not on file  Relationships  . Social connections:    Talks on phone: Not on file    Gets together: Not on file    Attends religious service: Not on file    Active member of club or organization: Not on file    Attends meetings of clubs or organizations: Not on file    Relationship status: Not on file  . Intimate partner violence:    Fear of current or ex partner: Not on file    Emotionally abused: Not on file    Physically abused: Not on file    Forced sexual activity: Not on file  Other Topics Concern  . Not on  file  Social History Narrative  . Not on file   History reviewed. No pertinent family history.  OBJECTIVE:  Vitals:   05/28/18 1624  BP: (!) 155/90  Pulse: 94  Resp: 18  Temp: 99 F (37.2 C)  TempSrc: Oral  SpO2: 95%    General appearance: alert; appears fatigued; nontoxic HEENT: Ears: EACs clear, TMs pearly gray; Eyes: PERRL.  EOM grossly intact.  Sinuses nontender; Nose: no obvious clear rhinorrhea; tonsils nonerythematous, uvula midline Neck: supple without LAD Lungs: unlabored respirations, symmetrical air entry; cough: mild; no respiratory distress; diffuse wheezes heard throughout bilateral lung fields; wheezes improved with duo-neb treatment Heart: regular rate and rhythm.  Radial pulses 2+ symmetrical bilaterally; mild tenderness with AP  compression Skin: warm and dry Psychological: alert and cooperative; normal mood and affect  Imaging: Dg Chest 2 View  Result Date: 05/28/2018 CLINICAL DATA:  Worsening cough for 5 days EXAM: CHEST - 2 VIEW COMPARISON:  None. FINDINGS: Normal heart size. Normal mediastinal contour. No pneumothorax. No pleural effusion. No consolidative airspace disease. Mild prominence of the parahilar interstitial markings. IMPRESSION: No consolidative airspace disease to suggest a pneumonia. Nonspecific mild prominence of the parahilar interstitial markings, cannot exclude atypical infection or interstitial lung disease. Electronically Signed   By: Delbert Phenix M.D.   On: 05/28/2018 16:57   ASSESSMENT & PLAN:  1. Lower respiratory infection     Meds ordered this encounter  Medications  . ipratropium-albuterol (DUONEB) 0.5-2.5 (3) MG/3ML nebulizer solution 3 mL  . albuterol (PROVENTIL HFA;VENTOLIN HFA) 108 (90 Base) MCG/ACT inhaler 2 puff  . azithromycin (ZITHROMAX) 250 MG tablet    Sig: Take 1 tablet (250 mg total) by mouth daily. Take first 2 tablets together, then 1 every day until finished.    Dispense:  6 tablet    Refill:  0    Order Specific Question:   Supervising Provider    Answer:   Isa Rankin [phone]  . benzonatate (TESSALON) 100 MG capsule    Sig: Take 1 capsule (100 mg total) by mouth every 8 (eight) hours.    Dispense:  21 capsule    Refill:  0    Order Specific Question:   Supervising Provider    Answer:   Isa Rankin [[id number]]   Duo-neb given in office X-rays cannot rule out atypical infection as this time Inhaler given in office.  Use as needed for wheezing or shortness of breath Get plenty of rest and push fluids Use OTC medications as needed for symptomatic relief of fever and body aches Azithromycin prescribed.  Take as prescribed and to completion.   Tessalon perles given.  Use as needed for cough Follow up with PCP or [facility] if  symptoms persist Return or go to ER if you have any new or worsening symptoms   Recommend repeat x-ray in 6 weeks to exclude underlying malignancy   Reviewed expectations re: course of current medical issues. Questions answered. Outlined signs and symptoms indicating need for more acute intervention. Patient verbalized understanding. After Visit Summary given.         Rennis Harding, PA-C 05/28/18 1725

## 2018-05-28 NOTE — ED Triage Notes (Signed)
Pt here for URI sx and chills

## 2018-05-28 NOTE — Discharge Instructions (Addendum)
Duo-neb given in office X-rays cannot rule out atypical infection as this time Inhaler given in office.  Use as needed for wheezing or shortness of breath Get plenty of rest and push fluids Use OTC medications as needed for symptomatic relief of fever and body aches Azithromycin prescribed.  Take as prescribed and to completion.   Tessalone perles given.  Use as needed for cough [facility] if symptoms persist Return or go to ER if you have any new or worsening symptoms   Recommend repeat x-ray in 6 weeks to exclude underlying malignancy

## 2020-05-12 ENCOUNTER — Other Ambulatory Visit: Payer: Self-pay

## 2020-05-12 ENCOUNTER — Encounter (HOSPITAL_COMMUNITY): Payer: Self-pay | Admitting: Emergency Medicine

## 2020-05-12 ENCOUNTER — Ambulatory Visit (HOSPITAL_COMMUNITY)
Admission: EM | Admit: 2020-05-12 | Discharge: 2020-05-12 | Disposition: A | Discharge status: Home / Self Care | Payer: Medicaid Other | Attending: Internal Medicine | Admitting: Internal Medicine

## 2020-05-12 DIAGNOSIS — S39012A Strain of muscle, fascia and tendon of lower back, initial encounter: Secondary | ICD-10-CM | POA: Diagnosis not present

## 2020-05-12 MED ORDER — CYCLOBENZAPRINE HCL 10 MG PO TABS: 10.0000 mg | ORAL_TABLET | Freq: Three times a day (TID) | ORAL | 0 refills | Status: DC | PRN

## 2020-05-12 MED ORDER — IBUPROFEN 600 MG PO TABS: 600.0000 mg | ORAL_TABLET | Freq: Four times a day (QID) | ORAL | 0 refills | Status: DC | PRN
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
## 2020-10-17 ENCOUNTER — Other Ambulatory Visit: Payer: Self-pay | Admitting: Family Medicine

## 2020-10-17 DIAGNOSIS — Z1231 Encounter for screening mammogram for malignant neoplasm of breast: Secondary | ICD-10-CM

## 2020-12-05 ENCOUNTER — Ambulatory Visit
Admission: RE | Admit: 2020-12-05 | Discharge: 2020-12-05 | Disposition: A | Discharge status: Home / Self Care | Payer: Medicaid Other | Source: Ambulatory Visit | Attending: Family Medicine | Admitting: Family Medicine

## 2020-12-05 ENCOUNTER — Other Ambulatory Visit: Payer: Self-pay

## 2020-12-05 DIAGNOSIS — Z1231 Encounter for screening mammogram for malignant neoplasm of breast: Secondary | ICD-10-CM

## 2020-12-06 ENCOUNTER — Ambulatory Visit: Payer: Medicaid Other | Attending: Physician Assistant | Admitting: Physical Therapy

## 2020-12-06 ENCOUNTER — Encounter: Payer: Self-pay | Admitting: Physical Therapy

## 2020-12-06 ENCOUNTER — Other Ambulatory Visit: Payer: Self-pay

## 2020-12-06 DIAGNOSIS — R293 Abnormal posture: Secondary | ICD-10-CM | POA: Diagnosis present

## 2020-12-06 DIAGNOSIS — G8929 Other chronic pain: Secondary | ICD-10-CM | POA: Insufficient documentation

## 2020-12-06 DIAGNOSIS — M6281 Muscle weakness (generalized): Secondary | ICD-10-CM | POA: Diagnosis not present

## 2020-12-06 DIAGNOSIS — M25512 Pain in left shoulder: Secondary | ICD-10-CM | POA: Insufficient documentation

## 2020-12-06 NOTE — Patient Instructions (Signed)
Access Code: [email or web address] URL: [email or web address] Date: 12/06/2020 Prepared by: Karie Mainland  Exercises Shoulder External Rotation and Scapular Retraction with Resistance - 1-2 x daily - 7 x weekly - 2-3 sets - 10 reps - 5 hold Supine Shoulder Horizontal Abduction with Resistance - 1-2 x daily - 7 x weekly - 2-3 sets - 10 reps - 30 hold Sidelying Thoracic Rotation with Open Book - 1-2 x daily - 7 x weekly - 1 sets - 5 reps - 15 hold
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
## 2020-12-07 ENCOUNTER — Other Ambulatory Visit: Payer: Self-pay | Admitting: Family Medicine

## 2020-12-07 DIAGNOSIS — R928 Other abnormal and inconclusive findings on diagnostic imaging of breast: Secondary | ICD-10-CM

## 2020-12-14 DIAGNOSIS — F4310 Post-traumatic stress disorder, unspecified: Secondary | ICD-10-CM | POA: Insufficient documentation

## 2020-12-14 DIAGNOSIS — G2581 Restless legs syndrome: Secondary | ICD-10-CM | POA: Insufficient documentation

## 2020-12-14 DIAGNOSIS — F900 Attention-deficit hyperactivity disorder, predominantly inattentive type: Secondary | ICD-10-CM | POA: Insufficient documentation

## 2020-12-20 ENCOUNTER — Ambulatory Visit: Payer: Medicaid Other | Admitting: Rehabilitative and Restorative Service Providers"

## 2020-12-25 ENCOUNTER — Other Ambulatory Visit: Payer: Self-pay

## 2020-12-25 ENCOUNTER — Ambulatory Visit: Payer: Medicaid Other | Attending: Physician Assistant | Admitting: Physical Therapy

## 2020-12-25 ENCOUNTER — Encounter: Payer: Self-pay | Admitting: Physical Therapy

## 2020-12-25 DIAGNOSIS — M6281 Muscle weakness (generalized): Secondary | ICD-10-CM

## 2020-12-25 DIAGNOSIS — M25512 Pain in left shoulder: Secondary | ICD-10-CM | POA: Insufficient documentation

## 2020-12-25 DIAGNOSIS — G8929 Other chronic pain: Secondary | ICD-10-CM

## 2020-12-25 DIAGNOSIS — R293 Abnormal posture: Secondary | ICD-10-CM

## 2020-12-25 NOTE — Therapy (Signed)
[name] [address] [place], [place], [address] Phone: [phone]   Fax:  [phone]  Physical Therapy Treatment  Patient Details  Name: Mindy [name] MRN: [id number] Date of Birth: 10/14/1975 Referring Provider (PT): Margart Sickles, [place]   Encounter Date: 12/25/2020   PT End of Session - 12/25/20 1046    Visit Number 2    Number of Visits 9    Date for PT Re-Evaluation 01/31/21    Authorization Type MCD healthy blue    Authorization Time Period 4/13- 5/31    Authorization - Visit Number 1    Authorization - Number of Visits 8    PT Start Time 1046    PT Stop Time 1136    PT Time Calculation (min) 50 min    Activity Tolerance Patient tolerated treatment well    Behavior During Therapy Mindy [name] for tasks assessed/performed           Past Medical History:  Diagnosis Date  . Hypertension     Past Surgical History:  Procedure Laterality Date  . c-sections    . CHOLECYSTECTOMY    . removal of axilla sweat glands      There were no vitals filed for this visit.   Subjective Assessment - 12/25/20 1049    Subjective Doing pretty well ,no pain right now.  The exercises cause a little pain but not for long.  I think the shots have helped my sleep on my side.  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    Currently in Pain? No/denies              [name] Adult PT Treatment/Exercise - 12/25/20 0001      Self-Care   Other Self-Care Comments  tennis ball, HEP, diff diagnosis      Shoulder Exercises: Standing   Horizontal ABduction Strengthening;Both;10 reps;Theraband    Theraband Level (Shoulder Horizontal ABduction) Level 2 (Red)    External Rotation Strengthening;Both;10 reps    Theraband Level (Shoulder External Rotation) Level 2 (Red)    Extension Strengthening;Both;20 reps;Theraband    Theraband Level (Shoulder Extension) Level 3 (Green)    Row [employer];Theraband    Theraband Level  (Shoulder Row) Level 3 (Green)      Shoulder Exercises: ROM/Strengthening   UBE (Upper Arm Bike) 5 min L 1 (2 . 5 min each direction)    Pendulum rest in between sets    Other ROM/Strengthening Exercises quadruped for scap retract/protract x 10    Other ROM/Strengthening Exercises thoracic rotation x 1 each side, LTR in supine with head turns x 5      Manual Therapy   Manual Therapy Joint mobilization;Soft tissue mobilization;Myofascial release;Manual Traction;Passive ROM    Joint Mobilization rotational mobs to low cervicals/thoracic    Soft tissue mobilization suboccipitals, upper trap and levator scap. L rhomboids, thoracic paraspinals    Myofascial Release upper back    Passive ROM L UE    Manual Traction gentle x 3                    PT Short Term Goals - 12/06/20 1514      PT SHORT TERM GOAL #1   Title Pt will be I with HEP for L UE, posture    Baseline given on eval    Time 4    Period Weeks    Status New    Target Date 01/03/21  PT SHORT TERM GOAL #2   Title Pt will be able to raise arm overhead with no pain in L shoulder to impact ADLs    Baseline tightness surrounding proximal shoulder    Time 4    Period Weeks    Status New    Target Date 01/03/21      PT SHORT TERM GOAL #3   Title Pt will be able to walk for 10 min without increased L arm pain    Baseline 1 block, pain severe    Time 4    Period Weeks    Status New    Target Date 01/03/21             PT Long Term Goals - 12/06/20 1517      PT LONG TERM GOAL #1   Title Pt will be able to sleep on L side for a portion of the night with min increase in pain of L UE    Baseline pain is moderate , stiffness    Time 8    Period Weeks    Status New    Target Date 01/31/21      PT LONG TERM GOAL #2   Title Pt will be able to increase functional reach of L UE to WNL, that of Rt UE  for ADLs.    Baseline limited to ER T1 and IR to Rt hip, low lumbar    Time 8    Period Weeks    Status  New    Target Date 01/31/21      PT LONG TERM GOAL #3   Title Pt will be able to demo 5/5 strength in L UE for maximal function with home and work activities    Baseline grossly 4/5    Time 8    Period Weeks    Status New    Target Date 01/31/21                 Plan - 12/25/20 1054    Clinical Impression Statement Patient shows fatigue in L UE with basic exercises including UBE and light bands, AAROM.  Pain focused more in lower cervical/upper thoracic spine.  Included flexibility for L upper quadrant today and offered tennis ball for self release of tight areas.    PT Treatment/Interventions ADLs/Self Care Home Management;Therapeutic activities;Passive range of motion;Dry needling;Taping;Neuromuscular re-education;Therapeutic exercise;Patient/family education;Functional mobility training;Cryotherapy;Moist Heat;Iontophoresis 4mg /ml Dexamethasone;Electrical Stimulation    PT Next Visit Plan general strengthening.  manual/STW to L UE/cervical/thoracic peri- scapular posture    PT Home Exercise Plan Access Code:    Consulted and Agree with Plan of Care Patient           Patient will benefit from skilled therapeutic intervention in order to improve the following deficits and impairments:  Decreased mobility,Difficulty walking,Impaired sensation,Obesity,Increased edema,Decreased range of motion,Decreased strength,Increased fascial restricitons,Impaired flexibility,Impaired UE functional use,Postural dysfunction,Pain  Visit Diagnosis: Muscle weakness (generalized)  Abnormal posture  Chronic left shoulder pain     Problem List Patient Active Problem List   Diagnosis Date Noted  . Essential hypertension 02/18/2015  . Pain, dental 02/18/2015    Mindy [name] 12/25/2020, 11:55 AM  [name] [address] [place], [place], [place] Phone: [phone]   Fax:  [phone]  Name: Mindy [name] MRN:  Veatrice Kells Date of Birth: [birth year]  04/27/1976, PT 12/25/20 11:55 AM Phone: [phone] Fax: [phone]

## 2020-12-27 ENCOUNTER — Other Ambulatory Visit: Payer: Self-pay

## 2020-12-27 ENCOUNTER — Ambulatory Visit
Admission: RE | Admit: 2020-12-27 | Discharge: 2020-12-27 | Disposition: A | Discharge status: Home / Self Care | Payer: Medicaid Other | Source: Ambulatory Visit | Attending: Family Medicine | Admitting: Family Medicine

## 2020-12-27 ENCOUNTER — Ambulatory Visit: Payer: Medicaid Other

## 2020-12-27 ENCOUNTER — Other Ambulatory Visit: Payer: Self-pay | Admitting: Family Medicine

## 2020-12-27 DIAGNOSIS — R928 Other abnormal and inconclusive findings on diagnostic imaging of breast: Secondary | ICD-10-CM

## 2021-01-01 ENCOUNTER — Ambulatory Visit: Payer: Medicaid Other | Admitting: Physical Therapy

## 2021-01-08 ENCOUNTER — Other Ambulatory Visit: Payer: Self-pay

## 2021-01-08 ENCOUNTER — Ambulatory Visit: Payer: Medicaid Other | Attending: Physician Assistant | Admitting: Physical Therapy

## 2021-01-08 DIAGNOSIS — R293 Abnormal posture: Secondary | ICD-10-CM | POA: Insufficient documentation

## 2021-01-08 DIAGNOSIS — G8929 Other chronic pain: Secondary | ICD-10-CM | POA: Diagnosis present

## 2021-01-08 DIAGNOSIS — M25512 Pain in left shoulder: Secondary | ICD-10-CM | POA: Diagnosis present

## 2021-01-08 DIAGNOSIS — M6281 Muscle weakness (generalized): Secondary | ICD-10-CM | POA: Insufficient documentation

## 2021-01-08 NOTE — Therapy (Addendum)
[place], [place], [address] Phone: [phone]   Fax:  [phone]  Physical Therapy Treatment  Patient Details  Name: Mindy [name] MRN: [id number] Date of Birth: June 10, 1976 Referring Provider (PT): Mindy [name], [place]   Encounter Date: 01/08/2021   PT End of Session - 01/08/21 1111    Visit Number 3    Number of Visits 9    Date for PT Re-Evaluation 01/31/21    Authorization Type MCD healthy blue    Authorization Time Period 4/13- 5/31    Authorization - Visit Number 2    Authorization - Number of Visits 8    PT Start Time 1112   came at different time than scheduled   PT Stop Time 1154    PT Time Calculation (min) 42 min    Activity Tolerance Patient tolerated treatment well    Behavior During Therapy Mindy [name] ([place]) for tasks assessed/performed           Past Medical History:  Diagnosis Date  . Hypertension     Past Surgical History:  Procedure Laterality Date  . c-sections    . CHOLECYSTECTOMY    . removal of axilla sweat glands      There were no vitals filed for this visit.   Subjective Assessment - 01/08/21 1114    Subjective Pt came from [place], very stressed due to work.  She is figuring it out.  No pain in her arm right now, really none over the weekend.  [place] food [place] and walked so much and did not have difficulty.    Currently in Pain? No/denies            [name] Adult PT Treatment/Exercise - 01/08/21 0001      Shoulder Exercises: Supine   Horizontal ABduction Strengthening;Both;10 reps    Theraband Level (Shoulder Horizontal ABduction) Level 3 (Green)    External Rotation Strengthening;Both;15 reps;Theraband    Theraband Level (Shoulder External Rotation) Level 3 (Green)    Other Supine Exercises supine wand x 10 :: overhead reach, horiz abd/add and ER      Shoulder Exercises: Seated   Flexion 10 reps;Strengthening    Theraband Level (Shoulder Flexion)  Level 3 (Green)    Abduction Strengthening;Left;10 reps    Theraband Level (Shoulder ABduction) Level 3 (Green)    Diagonals Strengthening;Left;10 reps;Theraband    Theraband Level (Shoulder Diagonals) Level 2 (Red)      Shoulder Exercises: Standing   Diagonals Strengthening;Left;10 reps    Theraband Level (Shoulder Diagonals) Level 2 (Red)      Shoulder Exercises: ROM/Strengthening   Pendulum rest in between sets    Other ROM/Strengthening Exercises lateral flexion neck stretches      Shoulder Exercises: Stretch   Cross Chest Stretch 2 reps;20 seconds    Other Shoulder Stretches sidelying thoracic rotation, open book, bend elbow on L side    Other Shoulder Stretches quadruped cat/cow x 10 added thoracic rotation x 5 each side      Manual Therapy   Joint Mobilization rotational mobs to low cervicals/thoracic    Soft tissue mobilization L rhomboid, levator scapula, upper trap                  PT Education - 01/08/21 1155    Education Details HEP, posture, tension headaches, changin appt times to suit her schedule better    Person(s) Educated Patient    Methods Explanation    Comprehension Verbalized understanding;Returned demonstration  PT Short Term Goals - 01/08/21 1156      PT SHORT TERM GOAL #1   Title Pt will be I with HEP for L UE, posture    Status On-going      PT SHORT TERM GOAL #2   Title Pt will be able to raise arm overhead with no pain in L shoulder to impact ADLs    Status On-going      PT SHORT TERM GOAL #3   Title Pt will be able to walk for 10 min without increased L arm pain    Status On-going             PT Long Term Goals - 12/06/20 1517      PT LONG TERM GOAL #1   Title Pt will be able to sleep on L side for a portion of the night with min increase in pain of L UE    Baseline pain is moderate , stiffness    Time 8    Period Weeks    Status New    Target Date 01/31/21      PT LONG TERM GOAL #2   Title Pt will be  able to increase functional reach of L UE to WNL, that of Rt UE  for ADLs.    Baseline limited to ER T1 and IR to Rt hip, low lumbar    Time 8    Period Weeks    Status New    Target Date 01/31/21      PT LONG TERM GOAL #3   Title Pt will be able to demo 5/5 strength in L UE for maximal function with home and work activities    Baseline grossly 4/5    Time 8    Period Weeks    Status New    Target Date 01/31/21                 Plan - 01/08/21 1156    Clinical Impression Statement Pt fatigues quickly with UE exercise.  Stiffness throughout cervical and thoracic spine, L medial scapular border the source.  Discussed tension headaches and referral of pain fom trigger points as a possible cause. Wil call to schedule more appts, PM may work better as she has to drive from [facility] and back again mid day.    PT Treatment/Interventions ADLs/Self Care Home Management;Therapeutic activities;Passive range of motion;Dry needling;Taping;Neuromuscular re-education;Therapeutic exercise;Patient/family education;Functional mobility training;Cryotherapy;Moist Heat;Iontophoresis 84m/ml Dexamethasone;Electrical Stimulation    PT Next Visit Plan general strengthening.  manual/STW to L UE/cervical/thoracic peri- scapular posture    PT Home Exercise Plan Access Code: [email or web address]   Consulted and Agree with Plan of Care Patient           Patient will benefit from skilled therapeutic intervention in order to improve the following deficits and impairments:  Decreased mobility,Difficulty walking,Impaired sensation,Obesity,Increased edema,Decreased range of motion,Decreased strength,Increased fascial restricitons,Impaired flexibility,Impaired UE functional use,Postural dysfunction,Pain  Visit Diagnosis: Muscle weakness (generalized)  Abnormal posture  Chronic left shoulder pain     Problem List Patient Active Problem List   Diagnosis Date Noted  . Essential hypertension 02/18/2015  . Pain,  dental 02/18/2015    Mindy [name] 01/08/2021, 12:02 PM  [facility] [address]Phone: [phone]  Fax:  [phone] Name: Mindy BOSAKMRN: [id number] of Birth: 804-30-1977 JRaeford [name] PT 01/08/21 12:02 PM Phone: [phone]: [phone]  PHYSICAL THERAPY DISCHARGE SUMMARY  Visits from Start of Care: 3  Current functional level related to goals / functional outcomes: See above for most recent    Remaining deficits: Unknown, see above for most recent info    Education / Equipment: HEP, posture  Plan: Patient agrees to discharge.  Patient goals were not met. Patient is being discharged due to not returning since the last visit.  ?????    Patient with work conflicts and long travel times.  She works in [employer]  and was going to consider transferring to a more convenient clinic.  Mindy [name], PT 02/14/21 1:58 PM Phone: [phone] Fax: [phone]

## 2021-04-12 DIAGNOSIS — G4700 Insomnia, unspecified: Secondary | ICD-10-CM | POA: Insufficient documentation

## 2021-07-04 ENCOUNTER — Other Ambulatory Visit: Payer: Self-pay

## 2021-07-04 ENCOUNTER — Ambulatory Visit
Admission: RE | Admit: 2021-07-04 | Discharge: 2021-07-04 | Disposition: A | Discharge status: Home / Self Care | Payer: Medicaid Other | Source: Ambulatory Visit | Attending: Family Medicine | Admitting: Family Medicine

## 2021-07-04 DIAGNOSIS — R928 Other abnormal and inconclusive findings on diagnostic imaging of breast: Secondary | ICD-10-CM

## 2021-11-20 DIAGNOSIS — Z1231 Encounter for screening mammogram for malignant neoplasm of breast: Secondary | ICD-10-CM

## 2021-12-05 ENCOUNTER — Other Ambulatory Visit: Payer: Self-pay

## 2021-12-05 ENCOUNTER — Encounter: Payer: Self-pay | Admitting: Internal Medicine

## 2021-12-05 ENCOUNTER — Ambulatory Visit (INDEPENDENT_AMBULATORY_CARE_PROVIDER_SITE_OTHER): Payer: Medicaid Other | Admitting: Internal Medicine

## 2021-12-05 VITALS — BP 150/74 | HR 92 | Ht 66.0 in | Wt 236.8 lb

## 2021-12-05 DIAGNOSIS — R079 Chest pain, unspecified: Secondary | ICD-10-CM

## 2021-12-05 DIAGNOSIS — Z01812 Encounter for preprocedural laboratory examination: Secondary | ICD-10-CM | POA: Diagnosis not present

## 2021-12-05 MED ORDER — METOPROLOL TARTRATE 100 MG PO TABS: ORAL_TABLET | ORAL | 0 refills | Status: DC

## 2021-12-05 MED ORDER — LOSARTAN POTASSIUM 25 MG PO TABS: 12.5000 mg | ORAL_TABLET | Freq: Every day | ORAL | 3 refills | Status: DC
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
## 2021-12-05 NOTE — Patient Instructions (Addendum)
Medication Instructions:  ?START losartan 12.5 mg daily ? ?*If you need a refill on your cardiac medications before your next appointment, please call your pharmacy* ? ? ?Lab Work: ?BMET today ? ?If you have labs (blood work) drawn today and your tests are completely normal, you will receive your results only by: ?MyChart Message (if you have MyChart) OR ?A paper copy in the mail ?If you have any lab test that is abnormal or we need to change your treatment, we will call you to review the results. ? ? ?Testing/Procedures: ?Coronary CTA-see instructions below ? ?Follow-Up: ?At [facility], you and your health needs are our priority.  As part of our continuing mission to provide you with exceptional heart care, we have created designated Provider Care Teams.  These Care Teams include your primary Cardiologist (physician) and Advanced Practice Providers (APPs -  Physician Assistants and Nurse Practitioners) who all work together to provide you with the care you need, when you need it. ? ?We recommend signing up for the patient portal called "MyChart".  Sign up information is provided on this After Visit Summary.  MyChart is used to connect with patients for Virtual Visits (Telemedicine).  Patients are able to view lab/test results, encounter notes, upcoming appointments, etc.  Non-urgent messages can be sent to your provider as well.   ?To learn more about what you can do with MyChart, go to [email or web address].   ? ?Your next appointment:   ?3 month(s) ? ?The format for your next appointment:   ?In Person ? ?Provider:   ?Dr. Wyline Mood  ? ?Other Instructions ? ? ?Your cardiac CT will be scheduled at one of the below locations:  ? ?[facility] ?[address] ?[place], [place] [address] ?[phone] ? ?If scheduled at [facility], please arrive at the [facility] and Children's Entrance (Entrance C2) of [facility] 30 minutes prior to test start time. ?You can use the FREE valet parking  offered at entrance C (encouraged to control the heart rate for the test)  ?Proceed to the [facility] Radiology Department (first floor) to check-in and test prep. ? ?All radiology patients and guests should use entrance C2 at [facility], accessed from [facility], even though the hospital's physical address listed is [address]. ? ? ? ?If scheduled at [facility], please arrive 15 mins early for check-in and test prep. ? ?Please follow these instructions carefully (unless otherwise directed): ? ?On the Night Before the Test: ?Be sure to Drink plenty of water. ?Do not consume any caffeinated/decaffeinated beverages or chocolate 12 hours prior to your test. ?Do not take any antihistamines 12 hours prior to your test. ? ?On the Day of the Test: ?Drink plenty of water until 1 hour prior to the test. ?Do not eat any food 4 hours prior to the test. ?You may take your regular medications prior to the test.  ?Take metoprolol (Lopressor) two hours prior to test. ?FEMALES- please wear underwire-free bra if available, avoid dresses & tight clothing ?     ?After the Test: ?Drink plenty of water. ?After receiving IV contrast, you may experience a mild flushed feeling. This is normal. ?On occasion, you may experience a mild rash up to 24 hours after the test. This is not dangerous. If this occurs, you can take Benadryl 25 mg and increase your fluid intake. ?If you experience trouble breathing, this can be serious. If it is severe call 911 IMMEDIATELY. If  it is mild, please call our office. ?If you take any of these medications: Glipizide/Metformin, Avandament, Glucavance, please do not take 48 hours after completing test unless otherwise instructed. ? ?We will call to schedule your test 2-4 weeks out understanding that some insurance companies will need an authorization prior to the service being performed.  ? ?For non-scheduling related questions, please contact the  cardiac imaging nurse navigator should you have any questions/concerns: ?Rockwell Alexandria, Cardiac Imaging Nurse Navigator ?Larey Brick, Cardiac Imaging Nurse Navigator ?[facility] ?Direct Office Dial: [phone]  ? ?For scheduling needs, including cancellations and rescheduling, please call [place], [phone]. ? ? ?

## 2021-12-06 LAB — BASIC METABOLIC PANEL
BUN/Creatinine Ratio: 16 (ref 9–23)
BUN: 9 mg/dL (ref 6–24)
CO2: 21 mmol/L (ref 20–29)
Calcium: 8.7 mg/dL (ref 8.7–10.2)
Chloride: 108 mmol/L — ABNORMAL HIGH (ref 96–106)
Creatinine, Ser: 0.58 mg/dL (ref 0.57–1.00)
Glucose: 88 mg/dL (ref 70–99)
Potassium: 4.1 mmol/L (ref 3.5–5.2)
Sodium: 144 mmol/L (ref 134–144)
eGFR: 114 mL/min/{1.73_m2} (ref >59)

## 2021-12-14 ENCOUNTER — Telehealth (HOSPITAL_COMMUNITY): Payer: Self-pay | Admitting: *Deleted

## 2021-12-14 NOTE — Telephone Encounter (Signed)
Reaching out to patient to offer assistance regarding upcoming cardiac imaging study; pt verbalizes understanding of appt date/time, parking situation and where to check in, pre-test NPO status and medications ordered, and verified current allergies; name and call back number provided for further questions should they arise ? ?Larey Brick RN Navigator Cardiac Imaging ?[facility] ?[phone] office ?[phone] cell ? ?Patient to take 100mg  metoprolol tartrate two hours prior to her cardiac CT scan.  She is aware to arrive at 2:45pm. ?

## 2021-12-17 ENCOUNTER — Ambulatory Visit (HOSPITAL_COMMUNITY): Admission: RE | Admit: 2021-12-17 | Payer: Medicaid Other | Source: Ambulatory Visit

## 2021-12-18 ENCOUNTER — Ambulatory Visit (HOSPITAL_COMMUNITY)
Admission: RE | Admit: 2021-12-18 | Discharge: 2021-12-18 | Disposition: A | Discharge status: Home / Self Care | Payer: Medicaid Other | Source: Ambulatory Visit | Attending: Internal Medicine | Admitting: Internal Medicine

## 2021-12-18 DIAGNOSIS — R079 Chest pain, unspecified: Secondary | ICD-10-CM | POA: Diagnosis present

## 2021-12-18 MED ORDER — NITROGLYCERIN 0.4 MG SL SUBL
SUBLINGUAL_TABLET | SUBLINGUAL | Status: AC
  Filled 2021-12-18: qty 2

## 2021-12-18 MED ORDER — DILTIAZEM HCL 25 MG/5ML IV SOLN
INTRAVENOUS | Status: AC
  Filled 2021-12-18: qty 5

## 2021-12-18 MED ORDER — NITROGLYCERIN 0.4 MG SL SUBL
0.8000 mg | SUBLINGUAL_TABLET | Freq: Once | SUBLINGUAL | Status: AC
  Administered 2021-12-18: 0.8 mg via SUBLINGUAL

## 2021-12-18 MED ORDER — METOPROLOL TARTRATE 5 MG/5ML IV SOLN
5.0000 mg | INTRAVENOUS | Status: DC | PRN
  Administered 2021-12-18 (×2): 5 mg via INTRAVENOUS

## 2021-12-18 MED ORDER — DILTIAZEM HCL 25 MG/5ML IV SOLN
10.0000 mg | Freq: Once | INTRAVENOUS | Status: AC
  Administered 2021-12-18: 10 mg via INTRAVENOUS

## 2021-12-18 MED ORDER — IOHEXOL 350 MG/ML SOLN
100.0000 mL | Freq: Once | INTRAVENOUS | Status: AC | PRN
  Administered 2021-12-18: 100 mL via INTRAVENOUS

## 2021-12-18 MED ORDER — METOPROLOL TARTRATE 5 MG/5ML IV SOLN
INTRAVENOUS | Status: AC
  Filled 2021-12-18: qty 10

## 2021-12-18 NOTE — Progress Notes (Signed)
CT scan completed. Tolerated well. D/C home ambulatory, awake and alert. In no distress. 

## 2021-12-24 ENCOUNTER — Encounter: Payer: Self-pay | Admitting: *Deleted

## 2022-01-24 DIAGNOSIS — Z720 Tobacco use: Secondary | ICD-10-CM | POA: Insufficient documentation

## 2022-02-07 ENCOUNTER — Ambulatory Visit: Payer: Medicaid Other | Admitting: Podiatry

## 2022-02-11 ENCOUNTER — Encounter: Payer: Medicaid Other | Admitting: Advanced Practice Midwife

## 2022-02-11 NOTE — Progress Notes (Deleted)
   GYNECOLOGY PROGRESS NOTE  History:  46 y.o. No obstetric history on file. presents to [facility] *** office today for problem gyn visit. She reports *****.  She denies h/a, dizziness, shortness of breath, n/v, or fever/chills.    The following portions of the patient's history were reviewed and updated as appropriate: allergies, current medications, past family history, past medical history, past social history, past surgical history and problem list. Last pap smear on *** was normal, *** HRHPV.  Health Maintenance Due  Topic Date Due   COVID-19 Vaccine (1) Never done   HIV Screening  Never done   Hepatitis C Screening  Never done   PAP SMEAR-Modifier  Never done     Review of Systems:  Pertinent items are noted in HPI.   Objective:  Physical Exam There were no vitals taken for this visit. VS reviewed, nursing note reviewed,  Constitutional: well developed, well nourished, no distress HEENT: normocephalic CV: normal rate Pulm/chest wall: normal effort Breast Exam: deferred Abdomen: soft Neuro: alert and oriented x 3 Skin: warm, dry Psych: affect normal Pelvic exam: Cervix pink, visually closed, without lesion, scant white creamy discharge, vaginal walls and external genitalia normal Bimanual exam: Cervix 0/long/high, firm, anterior, neg CMT, uterus nontender, nonenlarged, adnexa without tenderness, enlargement, or mass  Assessment & Plan:  There are no diagnoses linked to this encounter.  No follow-ups on file.   Sharen Counter, CNM 1:31 PM

## 2022-02-19 ENCOUNTER — Ambulatory Visit: Payer: Medicaid Other | Admitting: Podiatry

## 2022-02-19 ENCOUNTER — Ambulatory Visit: Payer: Medicaid Other | Admitting: Internal Medicine

## 2022-02-19 VITALS — BP 123/78 | HR 98 | Ht 66.0 in | Wt 239.8 lb

## 2022-02-19 DIAGNOSIS — R0681 Apnea, not elsewhere classified: Secondary | ICD-10-CM

## 2022-02-19 DIAGNOSIS — R0683 Snoring: Secondary | ICD-10-CM | POA: Diagnosis not present

## 2022-02-19 MED ORDER — LOSARTAN POTASSIUM 25 MG PO TABS: 25.0000 mg | ORAL_TABLET | Freq: Every day | ORAL | 5 refills | Status: AC

## 2022-02-19 NOTE — Patient Instructions (Signed)
Medication Instructions:  START: LOSARTAN 25mg  ONCE DAILY  *If you need a refill on your cardiac medications before your next appointment, please call your pharmacy*  Lab Work: None Ordered At This Time.  If you have labs (blood work) drawn today and your tests are completely normal, you will receive your results only by: [place] (if you have MyChart) OR A paper copy in the mail If you have any lab test that is abnormal or we need to change your treatment, we will call you to review the results.  Testing/Procedures: None Ordered At This Time.   Follow-Up: At [facility], you and your health needs are our priority.  As part of our continuing mission to provide you with exceptional heart care, we have created designated Provider Care Teams.  These Care Teams include your primary Cardiologist (physician) and Advanced Practice Providers (APPs -  Physician Assistants and Nurse Practitioners) who all work together to provide you with the care you need, when you need it.  Your next appointment:   1 year(s)  The format for your next appointment:   In Person  Provider:   Janina Mayo, MD    Other Instructions REFERRAL TO PULMONOLOGY FOR SLEEP STUDY
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
## 2022-02-26 ENCOUNTER — Ambulatory Visit (INDEPENDENT_AMBULATORY_CARE_PROVIDER_SITE_OTHER): Payer: Medicaid Other | Admitting: Podiatry

## 2022-02-26 ENCOUNTER — Encounter: Payer: Self-pay | Admitting: Podiatry

## 2022-02-26 DIAGNOSIS — B351 Tinea unguium: Secondary | ICD-10-CM | POA: Diagnosis not present

## 2022-02-26 MED ORDER — TERBINAFINE HCL 250 MG PO TABS: 250.0000 mg | ORAL_TABLET | Freq: Every day | ORAL | 0 refills | Status: DC

## 2022-02-26 NOTE — Progress Notes (Signed)
  Subjective:  Patient ID: Mindy [name], female    DOB: 05-Jan-1976,  MRN: [id number]  Chief Complaint  Patient presents with   Nail Problem     (New Patient) Diagnosis: Fungal infection of toenail, ingrown toenail of both feet Referring Provider: Quita Skye    46 y.o. female presents with the above complaint. History confirmed with patient.  She has discoloration and thickening and ingrown toenails, the left great toenail is the worst.  She may have injured this before as well  Objective:  Physical Exam: warm, good capillary refill, no trophic changes or ulcerative lesions, normal DP and PT pulses, normal sensory exam, and mycotic dystrophic discolored and disfigured toenails x10 bilateral.      Assessment:   1. Onychomycosis      Plan:  Patient was evaluated and treated and all questions answered.  We discussed the etiology and treatment options of onychomycosis and nail dystrophy in detail.  We discussed oral topical and surgical treatment of this with temporary total partial or permanent matricectomy's.  Currently I recommended oral treatment for the onychomycosis she has no contraindications to taking Lamisil and I prescribed this for 90 days for her.  Photographs were taken I will see her back in 4 months.  Discussed that the ingrown toenails worsen we could consider treatment of the sooner but hopefully will resolve with improvement in the onychomycosis.  I will see her back in 4 months.  Return in about 4 months (around 06/28/2022) for follow up after nail fungus treatment.

## 2022-03-03 ENCOUNTER — Ambulatory Visit (HOSPITAL_COMMUNITY)
Admission: EM | Admit: 2022-03-03 | Discharge: 2022-03-03 | Disposition: A | Discharge status: Home / Self Care | Payer: Medicaid Other | Attending: Physician Assistant | Admitting: Physician Assistant

## 2022-03-03 ENCOUNTER — Encounter (HOSPITAL_COMMUNITY): Payer: Self-pay | Admitting: Emergency Medicine

## 2022-03-03 DIAGNOSIS — M79601 Pain in right arm: Secondary | ICD-10-CM

## 2022-03-03 DIAGNOSIS — M79602 Pain in left arm: Secondary | ICD-10-CM | POA: Diagnosis not present

## 2022-03-03 DIAGNOSIS — L02412 Cutaneous abscess of left axilla: Secondary | ICD-10-CM | POA: Diagnosis not present

## 2022-03-03 DIAGNOSIS — L0291 Cutaneous abscess, unspecified: Secondary | ICD-10-CM

## 2022-03-03 MED ORDER — SULFAMETHOXAZOLE-TRIMETHOPRIM 800-160 MG PO TABS: 1.0000 | ORAL_TABLET | Freq: Two times a day (BID) | ORAL | 0 refills | Status: AC

## 2022-03-03 MED ORDER — HYDROCODONE-ACETAMINOPHEN 5-325 MG PO TABS: 1.0000 | ORAL_TABLET | ORAL | 0 refills | Status: DC | PRN

## 2022-03-03 NOTE — ED Provider Notes (Signed)
[facility]    CSN: [id number] Arrival date & time: 03/03/22  1404      History   Chief Complaint Chief Complaint  Patient presents with   Abscess    HPI Mindy [name] is a 46 y.o. female.   46 year old female with cyst left axilla.  Patient relates since Thursday she has been having a progressively enlarging cyst in the left axilla.  Patient relates this area is red, large, and extremely painful.  Patient relates that she has not been getting any relief from the pain with Advil or Tylenol.  Patient relates that it hurts really bad to make any movements with her left arm.  Patient feels like that it just keeps getting larger and larger.  Patient indicates that the area is firm and solid, but if she is wanting to see if it can be treated.  Patient denies fever or chills.   Abscess   Past Medical History:  Diagnosis Date   Hypertension     Patient Active Problem List   Diagnosis Date Noted   Essential hypertension 02/18/2015   Pain, dental 02/18/2015    Past Surgical History:  Procedure Laterality Date   c-sections     CHOLECYSTECTOMY     removal of axilla sweat glands      OB History   No obstetric history on file.      Home Medications    Prior to Admission medications   Medication Sig Start Date End Date Taking? Authorizing Provider  HYDROcodone-acetaminophen (NORCO/VICODIN) 5-325 MG tablet Take 1-2 tablets by mouth every 4 (four) hours as needed. 03/03/22  Yes Ellsworth Lennox, PA-C  sulfamethoxazole-trimethoprim (BACTRIM DS) 800-160 MG tablet Take 1 tablet by mouth 2 (two) times daily for 7 days. 03/03/22 03/10/22 Yes Ellsworth Lennox, PA-C  ADDERALL XR 20 MG 24 hr capsule Take 20 mg by mouth every morning. 11/08/21   [provider]  amLODipine (NORVASC) 10 MG tablet Take 10 mg by mouth daily. 07/30/21   [provider]  amphetamine-dextroamphetamine (ADDERALL) 10 MG tablet Take 10 mg by mouth daily. 11/08/21   [provider]   busPIRone (BUSPAR) 7.5 MG tablet Take 7.5 mg by mouth 3 (three) times daily as needed. 11/22/21   [provider]  doxepin (SINEQUAN) 10 MG capsule Take 10 mg by mouth at bedtime. 07/12/21   [provider]  escitalopram (LEXAPRO) 20 MG tablet Take 20 mg by mouth daily. 10/11/21   [provider]  losartan (COZAAR) 25 MG tablet Take 1 tablet (25 mg total) by mouth daily. 02/19/22   Maisie Fus, MD  terbinafine (LAMISIL) 250 MG tablet Take 1 tablet (250 mg total) by mouth daily. 02/26/22 05/27/22  Edwin Cap, DPM    Family History Family History  Problem Relation Age of Onset   Breast cancer Maternal Aunt     Social History Social History   Tobacco Use   Smoking status: Every Day    Packs/day: 0.25    Types: Cigarettes   Smokeless tobacco: Never  Substance Use Topics   Alcohol use: No   Drug use: No     Allergies   Morphine and related and Latex   Review of Systems Review of Systems  Skin:  Positive for wound (abcess left axilla).     Physical Exam Triage Vital Signs ED Triage Vitals  Enc Vitals Group     BP 03/03/22 1429 140/90     Pulse Rate 03/03/22 1429 (!) 101  Resp 03/03/22 1429 20     Temp 03/03/22 1429 98.9 F (37.2 C)     Temp Source 03/03/22 1429 Oral     SpO2 03/03/22 1429 98 %     Weight --      Height --      Head Circumference --      Peak Flow --      Pain Score 03/03/22 1426 9     Pain Loc --      Pain Edu? --      Excl. in GC? --    No data found.  Updated Vital Signs BP 140/90 (BP Location: Right Arm)   Pulse (!) 101   Temp 98.9 F (37.2 C) (Oral)   Resp 20   LMP 02/18/2022   SpO2 98%   Visual Acuity Right Eye Distance:   Left Eye Distance:   Bilateral Distance:    Right Eye Near:   Left Eye Near:    Bilateral Near:     Physical Exam Constitutional:      Appearance: Normal appearance.  Skin:    Comments: Left axilla: There is a large 5 cm x 3 cm abscess in the anterior axilla area.   Patient experiences severe pain when raising the arm to get a visualization of the cyst.  This area is solid and there is very little fluid present.  This area is red and inflamed but there is no streaking from the area.  No drainage present  Neurological:     Mental Status: She is alert.      UC Treatments / Results  Labs (all labs ordered are listed, but only abnormal results are displayed) Labs Reviewed - No data to display  EKG   Radiology No results found.  Procedures Procedures (including critical care time)  Medications Ordered in UC Medications - No data to display  Initial Impression / Assessment and Plan / UC Course  I have reviewed the triage vital signs and the nursing notes.  Pertinent labs & imaging results that were available during my care of the patient were reviewed by me and considered in my medical decision making (see chart for details).    Plan: 1.  Advised to take the Bactrim DS 1 every 12 hours until completed. 2.  Advised patient to take the Vicodin tablets 1-2 every 6 hours as needed for pain relief. 3.  Patient has been referred to [facility] through the internal referral system for an appointment to have the area evaluated. 4.  Advised to follow-up with PCP or return to urgent care if symptoms fail to improve. Final Clinical Impressions(s) / UC Diagnoses   Final diagnoses:  Abscess  Right arm pain     Discharge Instructions      Advised to continue using warm compresses frequently to the area minutes on 20 minutes off, 4-5 times throughout the day. Advised to take the Vicodin tablets 1-2 every 6 hours to help reduce the pain.  This medication may make you sleepy so use with caution. I have put in a referral to the surgeon, they should be calling you either Monday morning or Monday afternoon to set up an appointment to have the area evaluated. Take the Bactrim DS 1 every 12 hours until completed.   ED Prescriptions     Medication  Sig Dispense Auth. Provider   HYDROcodone-acetaminophen (NORCO/VICODIN) 5-325 MG tablet Take 1-2 tablets by mouth every 4 (four) hours as needed. 16 tablet Ellsworth Lennox, PA-C  sulfamethoxazole-trimethoprim (BACTRIM DS) 800-160 MG tablet Take 1 tablet by mouth 2 (two) times daily for 7 days. 14 tablet Ellsworth Lennox, PA-C      I have reviewed the PDMP during this encounter.   Ellsworth Lennox, PA-C 03/03/22 1456

## 2022-03-05 ENCOUNTER — Encounter: Payer: Self-pay | Admitting: Surgery

## 2022-03-05 ENCOUNTER — Ambulatory Visit: Payer: Medicaid Other | Admitting: Surgery

## 2022-03-05 ENCOUNTER — Other Ambulatory Visit: Payer: Self-pay

## 2022-03-05 VITALS — BP 138/79 | HR 108 | Temp 98.8°F | Ht 66.0 in | Wt 243.2 lb

## 2022-03-05 DIAGNOSIS — L02412 Cutaneous abscess of left axilla: Secondary | ICD-10-CM

## 2022-03-05 DIAGNOSIS — L732 Hidradenitis suppurativa: Secondary | ICD-10-CM

## 2022-03-05 DIAGNOSIS — Z6839 Body mass index (BMI) 39.0-39.9, adult: Secondary | ICD-10-CM

## 2022-03-05 MED ORDER — CLINDAMYCIN PHOSPHATE 1 % EX SOLN: Freq: Two times a day (BID) | CUTANEOUS | 0 refills | Status: AC

## 2022-03-13 ENCOUNTER — Encounter: Payer: Medicaid Other | Admitting: Physician Assistant

## 2022-03-14 ENCOUNTER — Encounter: Payer: Medicaid Other | Admitting: Physician Assistant

## 2022-03-22 ENCOUNTER — Ambulatory Visit (INDEPENDENT_AMBULATORY_CARE_PROVIDER_SITE_OTHER): Payer: Medicaid Other | Admitting: Adult Health

## 2022-03-22 ENCOUNTER — Encounter: Payer: Self-pay | Admitting: Adult Health

## 2022-03-22 VITALS — BP 112/80 | HR 91 | Temp 98.4°F | Ht 66.0 in | Wt 241.2 lb

## 2022-03-22 DIAGNOSIS — G4719 Other hypersomnia: Secondary | ICD-10-CM

## 2022-03-22 DIAGNOSIS — R0683 Snoring: Secondary | ICD-10-CM | POA: Insufficient documentation

## 2022-03-22 NOTE — Assessment & Plan Note (Signed)
Snoring, daytime sleepiness, restless sleep and witnessed apneic events, BMI 38 all suspicious for underlying sleep apnea  - discussed how weight can impact sleep and risk for sleep disordered breathing - discussed options to assist with weight loss: combination of diet modification, cardiovascular and strength training exercises   - had an extensive discussion regarding the adverse health consequences related to untreated sleep disordered breathing - specifically discussed the risks for hypertension, coronary artery disease, cardiac dysrhythmias, cerebrovascular disease, and diabetes - lifestyle modification discussed   - discussed how sleep disruption can increase risk of accidents, particularly when driving - safe driving practices were discussed   Set up for home sleep study  Patient Instructions  Set up for home sleep study Healthy sleep regimen  Do not drive if sleepy Work on healthy weight Follow-up in 6 weeks to discuss results and treatment plan

## 2022-03-22 NOTE — Patient Instructions (Signed)
Set up for home sleep study Healthy sleep regimen Do not drive if sleepy Work on healthy weight Follow-up in 6 weeks to discuss results and treatment plan 

## 2022-03-22 NOTE — Progress Notes (Signed)
Reviewed and agree with assessment/plan.   Alexius Ellington, MD [facility] 03/22/2022, 1:40 PM Pager:  [phone]  

## 2022-03-22 NOTE — Progress Notes (Signed)
@Patient  ID: Mindy [name], female    DOB: September 29, 1975, 46 y.o.   MRN: [id number]  Chief Complaint  Patient presents with   Consult    Referring provider: Janina Mayo, MD  HPI: 46 year old female seen for sleep consult March 22, 2022 for snoring, daytime sleepiness, restless sleep, witnessed apneic events  TEST/EVENTS :   03/22/2022 Sleep consult  Patient presents for a sleep consult today.  Kindly referred by primary care provider Dr. Harl Bowie.  Patient complains of loud snoring, restless sleep, daytime sleepiness.  Wakes up feeling tired all the time.  Has been going on for years. Spouse has reported that she has episodes that she stops breathing during sleep.  And she wakes up choking intermittently.  Typically goes to bed about 11 PM to 1 AM.  Takes about 30 minutes to go to sleep.  Sometimes takes melatonin to help with her sleep.  Is up 4-5 times each night.  Gets up about 8:30 AM.  She does not operate heavy machinery for work.  No previous sleep study.  Caffeine intake 2 cups of coffee . Epworth score is 13 out of 24.  Typically gets sleepy if she sits down and watches TV.  Or if she is an active.  Also get sleepy in the afternoon hours. No symptoms suspicious for cataplexy.  No history of congestive heart failure or stroke. Previously took doxepin for insomnia. Has not taken in long time.  Upper dentures .     Allergies  Allergen Reactions   Morphine    Morphine And Related Itching   Latex Rash    Immunization History  Administered Date(s) Administered   Tdap 03/06/2016    Past Medical History:  Diagnosis Date   Hypertension     Tobacco History: Social History   Tobacco Use  Smoking Status Every Day   Packs/day: 0.25   Types: Cigarettes  Smokeless Tobacco Never  Tobacco Comments   Smoking 1/2 ppd.  Trying to slow down.  Has nicotine patches, has not started yet.  03/22/2022 hfb   Ready to quit: Not Answered Counseling given: Not Answered Tobacco  comments: Smoking 1/2 ppd.  Trying to slow down.  Has nicotine patches, has not started yet.  03/22/2022 hfb   Past medical history significant for hypertension, ADHD, posttraumatic stress disorder, restless leg, hidradenitis suppurativa. Depression   Social history patient is married.  Has 3 children.  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  She smokes a half a pack of cigarettes daily.  No alcohol or drug use.  Family history positive for heart disease and cancer  Surgical hx : Gallbladder removal.    Outpatient Medications Prior to Visit  Medication Sig Dispense Refill   ADDERALL XR 20 MG 24 hr capsule Take 20 mg by mouth every morning.     amLODipine (NORVASC) 10 MG tablet Take 10 mg by mouth daily.     amphetamine-dextroamphetamine (ADDERALL) 10 MG tablet Take 10 mg by mouth daily.     busPIRone (BUSPAR) 7.5 MG tablet Take 7.5 mg by mouth 3 (three) times daily as needed.     doxepin (SINEQUAN) 10 MG capsule Take 10 mg by mouth at bedtime.     escitalopram (LEXAPRO) 20 MG tablet Take 20 mg by mouth daily.     losartan (COZAAR) 25 MG tablet Take 1 tablet (25 mg total) by mouth daily. 30 tablet 5   terbinafine (LAMISIL) 250 MG tablet Take 1 tablet (250 mg total) by mouth daily. 90 tablet  0   clindamycin (CLEOCIN T) 1 % external solution Apply topically 2 (two) times daily. (Patient not taking: Reported on 03/22/2022) 30 mL 0   HYDROcodone-acetaminophen (NORCO/VICODIN) 5-325 MG tablet Take 1-2 tablets by mouth every 4 (four) hours as needed. (Patient not taking: Reported on 03/22/2022) 16 tablet 0   No facility-administered medications prior to visit.     Review of Systems:   Constitutional:   No  weight loss, night sweats,  Fevers, chills, +fatigue, or  lassitude.  HEENT:   No headaches,  Difficulty swallowing,  Tooth/dental problems, or  Sore throat,                No sneezing, itching, ear ache, nasal congestion, post nasal drip,   CV:  No chest pain,  Orthopnea, PND, swelling in lower  extremities, anasarca, dizziness, palpitations, syncope.   GI  No heartburn, indigestion, abdominal pain, nausea, vomiting, diarrhea, change in bowel habits, loss of appetite, bloody stools.   Resp: No shortness of breath with exertion or at rest.  No excess mucus, no productive cough,  No non-productive cough,  No coughing up of blood.  No change in color of mucus.  No wheezing.  No chest wall deformity  Skin: no rash or lesions.  GU: no dysuria, change in color of urine, no urgency or frequency.  No flank pain, no hematuria   MS:  No joint pain or swelling.  No decreased range of motion.  No back pain.    Physical Exam  BP 112/80 (BP Location: Left Arm, Patient Position: Sitting, Cuff Size: Large)   Pulse 91   Temp 98.4 F (36.9 C) (Oral)   Ht 5\' 6"  (1.676 m)   Wt 241 lb 3.2 oz (109.4 kg)   LMP 02/18/2022   SpO2 99%   BMI 38.93 kg/m   GEN: A/Ox3; pleasant , NAD, well nourished    HEENT:  [place]/AT, NOSE-clear, THROAT-clear, no lesions, no postnasal drip or exudate noted.  Class 3 MP airway  NECK:  Supple w/ fair ROM; no JVD; normal carotid impulses w/o bruits; no thyromegaly or nodules palpated; no lymphadenopathy.    RESP  Clear  P & A; w/o, wheezes/ rales/ or rhonchi. no accessory muscle use, no dullness to percussion  CARD:  RRR, no m/r/g, no peripheral edema, pulses intact, no cyanosis or clubbing.  GI:   Soft & nt; nml bowel sounds; no organomegaly or masses detected.   Musco: Warm bil, no deformities or joint swelling noted.   Neuro: alert, no focal deficits noted.    Skin: Warm, no lesions or rashes    Lab Results:  CBC    Component Value Date/Time   WBC 5.3 10/10/2011 1503   RBC 4.28 10/10/2011 1503   HGB 13.6 02/18/2015 1524   HCT 40.0 02/18/2015 1524   PLT 376 10/10/2011 1503   MCV 84.3 10/10/2011 1503   MCH 29.2 10/10/2011 1503   MCHC 34.6 10/10/2011 1503   RDW 14.0 10/10/2011 1503    BMET    Component Value Date/Time   NA 144 12/05/2021  1128   K 4.1 12/05/2021 1128   CL 108 (H) 12/05/2021 1128   CO2 21 12/05/2021 1128   GLUCOSE 88 12/05/2021 1128   GLUCOSE 90 02/18/2015 1524   BUN 9 12/05/2021 1128   CREATININE 0.58 12/05/2021 1128   CALCIUM 8.7 12/05/2021 1128    BNP No results found for: "BNP"  ProBNP No results found for: "PROBNP"  Imaging: No results found.  No data to display          No results found for: "NITRICOXIDE"      Assessment & Plan:   Snoring Snoring, daytime sleepiness, restless sleep and witnessed apneic events, BMI 38 all suspicious for underlying sleep apnea  - discussed how weight can impact sleep and risk for sleep disordered breathing - discussed options to assist with weight loss: combination of diet modification, cardiovascular and strength training exercises   - had an extensive discussion regarding the adverse health consequences related to untreated sleep disordered breathing - specifically discussed the risks for hypertension, coronary artery disease, cardiac dysrhythmias, cerebrovascular disease, and diabetes - lifestyle modification discussed   - discussed how sleep disruption can increase risk of accidents, particularly when driving - safe driving practices were discussed   Set up for home sleep study  Patient Instructions  Set up for home sleep study Healthy sleep regimen  Do not drive if sleepy Work on healthy weight Follow-up in 6 weeks to discuss results and treatment plan       Rubye Oaks, NP 03/22/2022

## 2022-03-26 ENCOUNTER — Ambulatory Visit: Payer: Medicaid Other | Admitting: Physician Assistant

## 2022-04-09 ENCOUNTER — Encounter: Payer: Self-pay | Admitting: Adult Health

## 2022-04-18 ENCOUNTER — Other Ambulatory Visit: Payer: Self-pay | Admitting: Adult Health

## 2022-04-25 ENCOUNTER — Encounter (HOSPITAL_COMMUNITY): Payer: Self-pay | Admitting: *Deleted

## 2022-04-25 ENCOUNTER — Encounter (HOSPITAL_COMMUNITY): Payer: Self-pay

## 2022-04-25 ENCOUNTER — Other Ambulatory Visit: Payer: Self-pay

## 2022-04-25 ENCOUNTER — Emergency Department (HOSPITAL_COMMUNITY)
Admission: EM | Admit: 2022-04-25 | Discharge: 2022-04-25 | Disposition: A | Discharge status: Home / Self Care | Payer: Medicaid Other | Attending: Emergency Medicine | Admitting: Emergency Medicine

## 2022-04-25 ENCOUNTER — Ambulatory Visit (HOSPITAL_COMMUNITY)
Admission: EM | Admit: 2022-04-25 | Discharge: 2022-04-25 | Disposition: A | Discharge status: Nursing Home (Includes ICF) | Payer: Medicaid Other | Attending: Sports Medicine | Admitting: Sports Medicine

## 2022-04-25 ENCOUNTER — Emergency Department (HOSPITAL_COMMUNITY): Payer: Medicaid Other

## 2022-04-25 DIAGNOSIS — Z20822 Contact with and (suspected) exposure to covid-19: Secondary | ICD-10-CM | POA: Diagnosis not present

## 2022-04-25 DIAGNOSIS — I10 Essential (primary) hypertension: Secondary | ICD-10-CM | POA: Diagnosis not present

## 2022-04-25 DIAGNOSIS — J45901 Unspecified asthma with (acute) exacerbation: Secondary | ICD-10-CM

## 2022-04-25 DIAGNOSIS — J069 Acute upper respiratory infection, unspecified: Secondary | ICD-10-CM | POA: Diagnosis not present

## 2022-04-25 DIAGNOSIS — Z79899 Other long term (current) drug therapy: Secondary | ICD-10-CM | POA: Diagnosis not present

## 2022-04-25 DIAGNOSIS — Z8616 Personal history of COVID-19: Secondary | ICD-10-CM | POA: Insufficient documentation

## 2022-04-25 DIAGNOSIS — Z91040 Latex allergy status: Secondary | ICD-10-CM | POA: Insufficient documentation

## 2022-04-25 DIAGNOSIS — R0603 Acute respiratory distress: Secondary | ICD-10-CM

## 2022-04-25 DIAGNOSIS — R000 Tachycardia, unspecified: Secondary | ICD-10-CM | POA: Insufficient documentation

## 2022-04-25 DIAGNOSIS — J45909 Unspecified asthma, uncomplicated: Secondary | ICD-10-CM | POA: Insufficient documentation

## 2022-04-25 DIAGNOSIS — R051 Acute cough: Secondary | ICD-10-CM

## 2022-04-25 DIAGNOSIS — R0602 Shortness of breath: Secondary | ICD-10-CM

## 2022-04-25 LAB — BASIC METABOLIC PANEL
Anion gap: 6 (ref 5–15)
BUN: 5 mg/dL — ABNORMAL LOW (ref 6–20)
CO2: 23 mmol/L (ref 22–32)
Calcium: 8.7 mg/dL — ABNORMAL LOW (ref 8.9–10.3)
Chloride: 107 mmol/L (ref 98–111)
Creatinine, Ser: 0.65 mg/dL (ref 0.44–1.00)
GFR, Estimated: >60 mL/min (ref >60)
Glucose, Bld: 129 mg/dL — ABNORMAL HIGH (ref 70–99)
Potassium: 3.6 mmol/L (ref 3.5–5.1)
Sodium: 136 mmol/L (ref 135–145)

## 2022-04-25 LAB — CBC WITH DIFFERENTIAL/PLATELET
Abs Immature Granulocytes: 0.04 10*3/uL (ref 0.00–0.07)
Basophils Absolute: 0 10*3/uL (ref 0.0–0.1)
Basophils Relative: 0 %
Eosinophils Absolute: 0.1 10*3/uL (ref 0.0–0.5)
Eosinophils Relative: 1 %
HCT: 37 % (ref 36.0–46.0)
Hemoglobin: 12.3 g/dL (ref 12.0–15.0)
Immature Granulocytes: 1 %
Lymphocytes Relative: 11 %
Lymphs Abs: 0.8 10*3/uL (ref 0.7–4.0)
MCH: 27.5 pg (ref 26.0–34.0)
MCHC: 33.2 g/dL (ref 30.0–36.0)
MCV: 82.8 fL (ref 80.0–100.0)
Monocytes Absolute: 0.9 10*3/uL (ref 0.1–1.0)
Monocytes Relative: 11 %
Neutro Abs: 5.9 10*3/uL (ref 1.7–7.7)
Neutrophils Relative %: 76 %
Platelets: 396 10*3/uL (ref 150–400)
RBC: 4.47 MIL/uL (ref 3.87–5.11)
RDW: 15.9 % — ABNORMAL HIGH (ref 11.5–15.5)
WBC: 7.8 10*3/uL (ref 4.0–10.5)
nRBC: 0 % (ref 0.0–0.2)

## 2022-04-25 LAB — TROPONIN I (HIGH SENSITIVITY)
Troponin I (High Sensitivity): 3 ng/L (ref <18)
Troponin I (High Sensitivity): 3 ng/L (ref <18)

## 2022-04-25 LAB — BRAIN NATRIURETIC PEPTIDE: B Natriuretic Peptide: 8.9 pg/mL (ref 0.0–100.0)

## 2022-04-25 LAB — RESP PANEL BY RT-PCR (FLU A&B, COVID) ARPGX2
Influenza A by PCR: NEGATIVE
Influenza B by PCR: NEGATIVE
SARS Coronavirus 2 by RT PCR: NEGATIVE

## 2022-04-25 LAB — D-DIMER, QUANTITATIVE: D-Dimer, Quant: 0.45 ug/mL-FEU (ref 0.00–0.50)

## 2022-04-25 MED ORDER — HYDROCODONE-ACETAMINOPHEN 5-325 MG PO TABS
1.0000 | ORAL_TABLET | Freq: Once | ORAL | Status: AC
  Administered 2022-04-25: 1 via ORAL
  Filled 2022-04-25: qty 1

## 2022-04-25 MED ORDER — ACETAMINOPHEN 325 MG PO TABS
ORAL_TABLET | ORAL | Status: AC
  Filled 2022-04-25: qty 2

## 2022-04-25 MED ORDER — IPRATROPIUM-ALBUTEROL 0.5-2.5 (3) MG/3ML IN SOLN
3.0000 mL | Freq: Once | RESPIRATORY_TRACT | Status: AC
Start: 2022-04-25 — End: 2022-04-25
  Administered 2022-04-25: 3 mL via RESPIRATORY_TRACT
  Filled 2022-04-25: qty 3

## 2022-04-25 MED ORDER — KETOROLAC TROMETHAMINE 15 MG/ML IJ SOLN
15.0000 mg | Freq: Once | INTRAMUSCULAR | Status: AC
  Administered 2022-04-25: 15 mg via INTRAVENOUS
  Filled 2022-04-25: qty 1

## 2022-04-25 MED ORDER — PROCHLORPERAZINE EDISYLATE 10 MG/2ML IJ SOLN
10.0000 mg | Freq: Once | INTRAMUSCULAR | Status: AC
  Administered 2022-04-25: 10 mg via INTRAVENOUS
  Filled 2022-04-25: qty 2

## 2022-04-25 MED ORDER — DIPHENHYDRAMINE HCL 50 MG/ML IJ SOLN
12.5000 mg | Freq: Once | INTRAMUSCULAR | Status: AC
  Administered 2022-04-25: 12.5 mg via INTRAVENOUS
  Filled 2022-04-25: qty 1

## 2022-04-25 MED ORDER — METHYLPREDNISOLONE SODIUM SUCC 125 MG IJ SOLR
125.0000 mg | Freq: Once | INTRAMUSCULAR | Status: AC
  Administered 2022-04-25: 125 mg via INTRAVENOUS
  Filled 2022-04-25: qty 2

## 2022-04-25 MED ORDER — ALBUTEROL SULFATE HFA 108 (90 BASE) MCG/ACT IN AERS
2.0000 | INHALATION_SPRAY | Freq: Four times a day (QID) | RESPIRATORY_TRACT | 0 refills | Status: AC | PRN
Start: 2022-04-25 — End: ?

## 2022-04-25 MED ORDER — ALBUTEROL SULFATE (2.5 MG/3ML) 0.083% IN NEBU
INHALATION_SOLUTION | RESPIRATORY_TRACT | Status: AC
  Administered 2022-04-25: 10 mg
  Filled 2022-04-25: qty 12

## 2022-04-25 MED ORDER — ACETAMINOPHEN 325 MG PO TABS
650.0000 mg | ORAL_TABLET | Freq: Once | ORAL | Status: AC
  Administered 2022-04-25: 650 mg via ORAL

## 2022-04-25 MED ORDER — ACETAMINOPHEN 325 MG PO TABS
650.0000 mg | ORAL_TABLET | Freq: Once | ORAL | Status: DC
  Filled 2022-04-25: qty 2

## 2022-04-25 MED ORDER — MAGNESIUM SULFATE 2 GM/50ML IV SOLN
2.0000 g | Freq: Once | INTRAVENOUS | Status: AC
Start: 2022-04-25 — End: 2022-04-25
  Administered 2022-04-25: 2 g via INTRAVENOUS
  Filled 2022-04-25: qty 50

## 2022-04-25 MED ORDER — ONDANSETRON HCL 4 MG/2ML IJ SOLN
4.0000 mg | Freq: Once | INTRAMUSCULAR | Status: AC
  Administered 2022-04-25: 4 mg via INTRAVENOUS
  Filled 2022-04-25: qty 2

## 2022-04-25 MED ORDER — ALBUTEROL (5 MG/ML) CONTINUOUS INHALATION SOLN
10.0000 mg/h | INHALATION_SOLUTION | Freq: Once | RESPIRATORY_TRACT | Status: DC
  Filled 2022-04-25: qty 0.5

## 2022-04-25 MED ORDER — DIPHENHYDRAMINE HCL 50 MG/ML IJ SOLN
12.5000 mg | Freq: Once | INTRAMUSCULAR | Status: AC
Start: 2022-04-25 — End: 2022-04-25
  Administered 2022-04-25: 12.5 mg via INTRAVENOUS
  Filled 2022-04-25: qty 1

## 2022-04-25 MED ORDER — SODIUM CHLORIDE 0.9 % IV BOLUS
1000.0000 mL | Freq: Once | INTRAVENOUS | Status: AC
  Administered 2022-04-25: 1000 mL via INTRAVENOUS

## 2022-04-25 MED ORDER — ALBUTEROL SULFATE (2.5 MG/3ML) 0.083% IN NEBU: 5.0000 mg/h | INHALATION_SOLUTION | Freq: Once | RESPIRATORY_TRACT | Status: DC

## 2022-04-25 NOTE — ED Notes (Signed)
O2 placed at 2L via [place] with SaO2 increased up to 95%. Dr Leonor Liv notified of pt.

## 2022-04-25 NOTE — Discharge Instructions (Addendum)
Patient was discharged to the ED, transport called via [facility].

## 2022-04-25 NOTE — ED Provider Triage Note (Signed)
Emergency Medicine Provider Triage Evaluation Note  SHANNIN NAAB , a 46 y.o. female  was evaluated in triage.  Pt complains of body aches, fevers (100.5), SHOB, CP, onset Monday. Home COVID test negative.  Smoker. NO history of COPD, asthma Exposed to COVID + person within the last 2 weeks.  Sent by UC via EMS for O2 sat 90% in UC, improved to 95% with 2L Review of Systems  Positive: SHOB, CP, fever, body aches Negative: Vomiting  Physical Exam  BP 139/87 (BP Location: Right Arm)   Pulse (!) 109   Temp (!) 100.4 F (38 C) (Oral)   Resp (!) 22   LMP  (LMP Unknown)   SpO2 95%  Gen:   Awake, no distress   Resp:  Normal effort  MSK:   Moves extremities without difficulty  Other:    Medical Decision Making  Medically screening exam initiated at 10:40 AM.  Appropriate orders placed.  AZUL COFFIE was informed that the remainder of the evaluation will be completed by another provider, this initial triage assessment does not replace that evaluation, and the importance of remaining in the ED until their evaluation is complete.     Jeannie Fend, PA-C 04/25/22 1043

## 2022-04-25 NOTE — ED Provider Notes (Signed)
[facility]    CSN: [id number] Arrival date & time: 04/25/22  0913      History   Chief Complaint Chief Complaint  Patient presents with   Cough   Shortness of Breath    HPI Mindy [name] is a 46 y.o. female.   Mindy [name] presents today with shortness of breath for the past 2 days that worsened this morning when she woke up.  She states she started feeling ill on Tuesday and was having cough and shortness of breath that was improved with her albuterol inhaler. Her symptoms continued to worsen including cough, fevers, chills, body aches and fatigue. She reports she had a negative at-home COVID test, and has not received any COVID vaccines. She also endorses cigarette smoking and a history of asthma. She denies any sick contacts at home.  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      Home Medications    Prior to Admission medications   Medication Sig Start Date End Date Taking? Authorizing Provider  ADDERALL XR 20 MG 24 hr capsule Take 20 mg by mouth every morning. 11/08/21  Yes [provider]  amLODipine (NORVASC) 10 MG tablet Take 10 mg by mouth daily. 07/30/21  Yes [provider]  amphetamine-dextroamphetamine (ADDERALL) 10 MG tablet Take 10 mg by mouth daily. 11/08/21  Yes [provider]  busPIRone  (BUSPAR) 7.5 MG tablet Take 7.5 mg by mouth 3 (three) times daily as needed. 11/22/21  Yes [provider]  escitalopram (LEXAPRO) 20 MG tablet Take 20 mg by mouth daily. 10/11/21  Yes [provider]  losartan (COZAAR) 25 MG tablet Take 1 tablet (25 mg total) by mouth daily. 02/19/22  Yes Maisie Fus, MD  terbinafine (LAMISIL) 250 MG tablet Take 1 tablet (250 mg total) by mouth daily. 02/26/22 05/27/22 Yes McDonald, Rachelle Hora, DPM  clindamycin (CLEOCIN T) 1 % external solution Apply topically 2 (two) times daily. Patient not taking: Reported on 03/22/2022 03/05/22   Campbell Lerner, MD  doxepin (SINEQUAN) 10 MG capsule Take 10 mg by mouth at bedtime. 07/12/21   [provider]  traZODone (DESYREL) 50 MG tablet Take 50 mg by mouth at bedtime. 04/17/22   [provider]    Family History Family History  Problem Relation Age of Onset   Breast cancer Maternal Aunt     Social History Social History   Tobacco Use   Smoking status: Every Day    Packs/day: 0.25    Types: Cigarettes   Smokeless tobacco: Never   Tobacco comments:    Smoking 1/2 ppd.  Trying to slow down.  Has nicotine patches, has not started yet.  03/22/2022 hfb  Substance Use Topics   Alcohol use: No   Drug use: No     Allergies  Morphine, Morphine and related, and Latex   Review of Systems Review of Systems  Respiratory:  Positive for cough and shortness of breath.   As listed above in HPI   Physical Exam Triage Vital Signs ED Triage Vitals  Enc Vitals Group     BP 04/25/22 0927 (!) 142/89     Pulse Rate 04/25/22 0927 (!) 106     Resp 04/25/22 0927 (!) 24     Temp 04/25/22 0927 (!) 100.4 F (38 C)     Temp Source 04/25/22 0927 Oral     SpO2 04/25/22 0927 90 %     Weight --      Height --      Head Circumference --      Peak Flow --      Pain Score 04/25/22 0925 10     Pain Loc --      Pain Edu? --      Excl. in GC? --    No data found.  Updated Vital Signs BP (!)  142/89 (BP Location: Right Arm)   Pulse (!) 102   Temp (!) 100.4 F (38 C) (Oral)   Resp (!) 24   LMP  (LMP Unknown)   SpO2 95%   Physical Exam Vitals reviewed.  Constitutional:      General: She is in acute distress.     Appearance: She is well-developed. She is ill-appearing and diaphoretic.  HENT:     Head: Normocephalic.     Mouth/Throat:     Mouth: Mucous membranes are moist.  Eyes:     Pupils: Pupils are equal, round, and reactive to light.  Cardiovascular:     Rate and Rhythm: Regular rhythm. Tachycardia present.     Heart sounds: No murmur heard. Pulmonary:     Effort: Tachypnea and respiratory distress present.     Breath sounds: Examination of the left-upper field reveals wheezing. Wheezing present.     Comments: 2 L nasal cannula Chest:     Chest wall: Tenderness present.  Abdominal:     Palpations: Abdomen is soft.  Musculoskeletal:     Right lower leg: No edema.     Left lower leg: No edema.  Skin:    General: Skin is warm.  Neurological:     Mental Status: She is alert.  Psychiatric:        Mood and Affect: Mood is anxious.      UC Treatments / Results  Labs (all labs ordered are listed, but only abnormal results are displayed) Labs Reviewed - No data to display  EKG   Radiology No results found.  Procedures Procedures (including critical care time)  Medications Ordered in UC Medications  acetaminophen (TYLENOL) tablet 650 mg (650 mg Oral Given 04/25/22 0954)    Initial Impression / Assessment and Plan / UC Course  I have reviewed the triage vital signs and the nursing notes.  Pertinent labs & imaging results that were available during my care of the patient were reviewed by me and considered in my medical decision making (see chart for details).    Shortness of breath Patient presented to urgent care initially satting 90% on room air, tachycardic, tachypneic and febrile.  She was placed on 2 L of nasal cannula with sats ranging from  92 to 94%.  650 mg Tylenol administered.  She reported negative at home COVID test a couple days ago.  States she has not had any COVID vaccines.  Patient was awake alert and  oriented to self, time, location and situation, but she was very lethargic.  I recommended patient seek treatment at the ER as we do not have capabilities for ABG or further respiratory assistance.  [facility] was called and they transported the patient. Final Clinical Impressions(s) / UC Diagnoses   Final diagnoses:  Shortness of breath  Acute cough  Acute respiratory distress  Severe asthma with exacerbation, unspecified whether persistent     Discharge Instructions      Patient was discharged to the ED, transport called via [facility].    ED Prescriptions   None    PDMP not reviewed this encounter.   Claudie Leach, MD 04/25/22 1019

## 2022-04-25 NOTE — ED Notes (Signed)
Patient is being discharged from the Urgent Care and sent to the Emergency Department via [facility] . Per Dr Leonor Liv, patient is in need of higher level of care due to hypoxia, dyspnea, cough. Patient is aware and verbalizes understanding of plan of care.  Vitals:   04/25/22 0927 04/25/22 0946  BP: (!) 142/89   Pulse: (!) 106 (!) 102  Resp: (!) 24 (!) 24  Temp: (!) 100.4 F (38 C)   SpO2: 90% 93%

## 2022-04-25 NOTE — ED Notes (Signed)
Pulse oximetry monitored while pt ambulated in department. Pt able to maintain saturation >95% on room air with no distress noted. EDP also observed ambulation trial.

## 2022-04-25 NOTE — ED Notes (Signed)
[facility] called for transport to ED.

## 2022-04-25 NOTE — Discharge Instructions (Addendum)
Use the inhaler as needed for shortness of breath.  It may be used up to 4 times a day.  Tylenol and ibuprofen are both good options for body aches and fevers.  Please follow-up with a primary care doctor if you are not feeling better within the next week.  I have also attached a pulmonology office for you to see about any potential asthma.  It may be the same office that you see for sleep apnea.  It was a pleasure to meet you, please return with any worsening symptoms but I hope you feel better!

## 2022-04-25 NOTE — ED Triage Notes (Signed)
Patient has a cough and feels short of breath. The cold started like a week ago and the SOB started this morning. Has taken OTC meds with no help.

## 2022-04-25 NOTE — ED Notes (Signed)
Report called to ED Charge RN.

## 2022-04-25 NOTE — ED Provider Notes (Signed)
[facility] EMERGENCY DEPARTMENT Provider Note   CSN: [id number] Arrival date & time: 04/25/22  1021     History  Chief Complaint  Patient presents with   Covid Positive    Mindy [name] is a 46 y.o. female with a past medical history of hypertension and tobacco use disorder presenting today with shortness of breath, body aches and headaches.  She reports that this has been going on since Monday.  She was seen by urgent care today and they sent her here due to increased work of breathing, fever and low oxygen on room air.  Patient reports that she has been around her grandson who is 77 months old and has COVID.  Says that her husband also had a cold.  Not vaccinated against COVID-19.  She believes she was diagnosed with asthma in the past but is not on any medications.   HPI     Home Medications Prior to Admission medications   Medication Sig Start Date End Date Taking? Authorizing Provider  ADDERALL XR 20 MG 24 hr capsule Take 20 mg by mouth every morning. 11/08/21   [provider]  amLODipine (NORVASC) 10 MG tablet Take 10 mg by mouth daily. 07/30/21   [provider]  amphetamine-dextroamphetamine (ADDERALL) 10 MG tablet Take 10 mg by mouth daily. 11/08/21   [provider]  busPIRone (BUSPAR) 7.5 MG tablet Take 7.5 mg by mouth 3 (three) times daily as needed. 11/22/21   [provider]  clindamycin (CLEOCIN T) 1 % external solution Apply topically 2 (two) times daily. Patient not taking: Reported on 03/22/2022 03/05/22   Campbell Lerner, MD  doxepin (SINEQUAN) 10 MG capsule Take 10 mg by mouth at bedtime. 07/12/21   [provider]  escitalopram (LEXAPRO) 20 MG tablet Take 20 mg by mouth daily. 10/11/21   [provider]  losartan (COZAAR) 25 MG tablet Take 1 tablet (25 mg total) by mouth daily. 02/19/22   Maisie Fus, MD  terbinafine (LAMISIL) 250 MG tablet Take 1 tablet (250 mg total) by mouth daily. 02/26/22  05/27/22  McDonald, Rachelle Hora, DPM  traZODone (DESYREL) 50 MG tablet Take 50 mg by mouth at bedtime. 04/17/22   [provider]      Allergies    Morphine, Morphine and related, and Latex    Review of Systems   Review of Systems  Physical Exam Updated Vital Signs BP (!) 140/90 (BP Location: Left Arm)   Pulse (!) 105   Temp 100.3 F (37.9 C) (Oral)   Resp (!) 21   LMP  (LMP Unknown)   SpO2 96%  Physical Exam Vitals and nursing note reviewed.  Constitutional:      General: She is not in acute distress.    Appearance: Normal appearance. She is ill-appearing. She is not diaphoretic.  HENT:     Head: Normocephalic and atraumatic.     Mouth/Throat:     Mouth: Mucous membranes are dry.     Pharynx: Oropharynx is clear.  Eyes:     General: No scleral icterus.    Conjunctiva/sclera: Conjunctivae normal.  Cardiovascular:     Rate and Rhythm: Regular rhythm. Tachycardia present.  Pulmonary:     Effort: No respiratory distress.     Breath sounds: No wheezing.     Comments: Increased work of breathing Musculoskeletal:     Cervical back: Normal range of motion.  Skin:    General: Skin is warm and dry.  Findings: No rash.  Neurological:     Mental Status: She is alert.  Psychiatric:        Mood and Affect: Mood normal.        Behavior: Behavior normal.     ED Results / Procedures / Treatments   Labs (all labs ordered are listed, but only abnormal results are displayed) Labs Reviewed  BASIC METABOLIC PANEL - Abnormal; Notable for the following components:      Result Value   Glucose, Bld 129 (*)    BUN 5 (*)    Calcium 8.7 (*)    All other components within normal limits  CBC WITH DIFFERENTIAL/PLATELET - Abnormal; Notable for the following components:   RDW 15.9 (*)    All other components within normal limits  RESP PANEL BY RT-PCR (FLU A&B, COVID) [id number]  BRAIN NATRIURETIC PEPTIDE  D-DIMER, QUANTITATIVE  TROPONIN I (HIGH SENSITIVITY)  TROPONIN I (HIGH  SENSITIVITY)    EKG None  Radiology DG Chest 2 View  Result Date: 04/25/2022 CLINICAL DATA:  Fever, cough, shortness of breath EXAM: CHEST - 2 VIEW COMPARISON:  05/28/2018 FINDINGS: Cardiac size is within normal limits. There are no signs of pulmonary edema or focal pulmonary consolidation. Small transverse linear density seen in the left mid lung field may suggest minimal scarring or subsegmental atelectasis. There is no pleural effusion or pneumothorax. IMPRESSION: There are no signs of pulmonary edema or focal pulmonary consolidation. Small transverse linear densities in left mid lung fields suggest scarring or subsegmental atelectasis. Electronically Signed   By: Ernie Avena M.D.   On: 04/25/2022 11:07    Procedures Procedures   Medications Ordered in ED Medications  ipratropium-albuterol (DUONEB) 0.5-2.5 (3) MG/3ML nebulizer solution 3 mL (has no administration in time range)  ketorolac (TORADOL) 15 MG/ML injection 15 mg (has no administration in time range)  sodium chloride 0.9 % bolus 1,000 mL (has no administration in time range)  ondansetron (ZOFRAN) injection 4 mg (has no administration in time range)  diphenhydrAMINE (BENADRYL) injection 12.5 mg (has no administration in time range)    ED Course/ Medical Decision Making/ A&P Clinical Course as of 04/25/22 2031  Thu Apr 25, 2022  1735 Patient with better work of breathing.  Completing sentences without difficulty.  Continues to be tachypneic around 23.  Lung sounds remain clear.  She reports feeling a little bit better.  Dimer still pending [MR]  1831 D-Dimer, Quant: 0.45 [MR]  1949 Patient does not want to ambulate.  She reports that her headache is too severe.  Has been given Toradol, Benadryl and Vicodin.  We will try Compazine.  Neurologically intact, low suspicion intracranial abnormality [MR]  2031 Patient ambulating in the department.  I visualize this and her oxygen saturation remained at 96%.  She and her  husband would like to be discharged at this time. [MR]    Clinical Course User Index [MR] Christinna Sprung, Gabriel Cirri, PA-C                           Medical Decision Making Amount and/or Complexity of Data Reviewed Labs: ordered. Decision-making details documented in ED Course.  Risk OTC drugs. Prescription drug management.   This patient presents to the ED for concern of shortness of breath. The emergent differential diagnosis for shortness of breath includes, but is not limited to, Pulmonary edema, bronchoconstriction, Pneumonia, Pulmonary embolism, Pneumotherax/ Hemothorax, Dysrythmia, ACS.  Additional considerations in this patient's case include COVID-19/flu or  other viral illness    This is not an exhaustive differential.    Past Medical History / Co-morbidities / Social History: Tobacco use, obesity and asthma (?)   Additional history: I reviewed patient's urgent care note prior to her arrival.  It appears that her oxygen saturations were around 90.  This has not been the case in the ED today.   Physical Exam: Pertinent physical exam findings include increased work of breathing.  No wheezing or rales.  Lab Tests: I ordered, and personally interpreted labs.  The pertinent results include:  Normal white count Normal BNP Negative COVID or flu Negative troponins Negative dimer   Imaging Studies: I ordered and independently visualized and interpreted CXR which showed no signs of pulmonary edema or consolidations.  Cardiac Monitoring:  The patient was maintained on a cardiac monitor.  Remained in sinus tachycardia   Medications: I ordered medication including IVF, headache cocktail, albuterol, mag and Solu-Medrol. Reevaluation of the patient after these medicines showed that the patient improved. I have reviewed the patients home medicines and have made adjustments as needed.  Disposition: This is a 46 year old female with a past medical history of asthma presenting today due  to the complaint of shortness of breath.  She was at urgent care earlier and they sent her over here due to increased work of breathing and decreased oxygen saturations.  She reports being sick with viral symptoms since Monday and that her shortness of breath has become progressively worse.  Work-up today revealing of no acute findings outside of vital signs.  Dimer was ordered due to patient's tachycardia, low-grade fever, tobacco use and recent automobile travels.  This was negative.  At this point, her work-up is negative.  She will be stable for discharge with an inhaler if she is able to ambulate without dropping her oxygen saturations.  8:30p: Patient was ambulated with no change in vital signs.  Remains mildly tachycardic however I believe this is secondary to DuoNeb treatments.  Elevated blood pressure consistent with essential hypertension and patient has not had her antihypertensives since being in the ED.  At this time I believe she is stable for discharge home with an inhaler.  Return precautions discussed and she will follow-up outpatient if her symptoms continue.  Suspect this was a potential asthma exacerbation secondary to viral illness.  We will follow-up with PCP and pulmonology.  I discussed this case with my attending physician Dr. Truett Mainland who cosigned this note including patient's presenting symptoms, physical exam, and planned diagnostics and interventions. Attending physician stated agreement with plan or made changes to plan which were implemented.      Final Clinical Impression(s) / ED Diagnoses Final diagnoses:  Viral URI with cough    Rx / DC Orders ED Discharge Orders          Ordered    albuterol (VENTOLIN HFA) 108 (90 Base) MCG/ACT inhaler  Every 6 hours PRN        04/25/22 2029           Results and diagnoses were explained to the patient. Return precautions discussed in full. Patient had no additional questions and expressed complete understanding.   This  chart was dictated using voice recognition software.  Despite best efforts to proofread,  errors can occur which can change the documentation meaning.    Darliss Ridgel 04/25/22 2033    Cristie Hem, MD 04/25/22 2253

## 2022-04-25 NOTE — ED Notes (Signed)
This RN at bedside to assess pt and perform ordered tasks. Pt noted to be lying in bed with blanket covering face. Pt stats she has a severe headache. RN administered ordered pain medication. Pt refuses trial ambulation at this time stating her headache is too severe. Will notify provider of the same.

## 2022-04-25 NOTE — ED Triage Notes (Addendum)
Patient BIB [facility] from [facility], patient COVID - at home, and [facility] did not test her . Cough, SHOB for one week,febrile, crackles heard in the lungs. Pt given tylenol PTA.

## 2022-04-25 NOTE — ED Notes (Signed)
Report given to [facility]. 

## 2022-05-14 ENCOUNTER — Other Ambulatory Visit: Payer: Self-pay | Admitting: Podiatry

## 2022-05-24 ENCOUNTER — Ambulatory Visit (INDEPENDENT_AMBULATORY_CARE_PROVIDER_SITE_OTHER): Payer: Medicaid Other

## 2022-05-24 DIAGNOSIS — G4719 Other hypersomnia: Secondary | ICD-10-CM

## 2022-06-03 ENCOUNTER — Ambulatory Visit: Payer: Medicaid Other

## 2022-06-14 ENCOUNTER — Ambulatory Visit (INDEPENDENT_AMBULATORY_CARE_PROVIDER_SITE_OTHER): Payer: Medicaid Other

## 2022-06-25 ENCOUNTER — Ambulatory Visit (INDEPENDENT_AMBULATORY_CARE_PROVIDER_SITE_OTHER): Payer: Medicaid Other | Admitting: Podiatry

## 2022-06-25 DIAGNOSIS — Z91199 Patient's noncompliance with other medical treatment and regimen due to unspecified reason: Secondary | ICD-10-CM

## 2022-06-27 NOTE — Progress Notes (Signed)
Patient was no-show for appointment today 

## 2022-07-04 ENCOUNTER — Telehealth: Payer: Self-pay | Admitting: Adult Health

## 2022-07-04 NOTE — Telephone Encounter (Signed)
Looked in her chart, it appears the results have not been uploaded to her chart yet.   PCC[facility], can we get her results uploaded to her chart? Thanks!

## 2022-07-04 NOTE — Telephone Encounter (Signed)
Called patient and she states that she did her home sleep study on 10/6. She is wondering if the results are back at this time.  Please advise

## 2022-07-04 NOTE — Telephone Encounter (Signed)
I do not see results , can we get the results so I can look at

## 2022-07-05 ENCOUNTER — Telehealth: Payer: Self-pay | Admitting: Pulmonary Disease

## 2022-07-05 DIAGNOSIS — R0683 Snoring: Secondary | ICD-10-CM

## 2022-07-05 NOTE — Telephone Encounter (Signed)
She has tried doing a home sleep study 4 times without success in obtaining a valid study with sufficient data collection time.  Please let her know that I have placed an order for an in lab split night sleep study to further assess whether she has obstructive sleep apnea.  Routing message to [employer] also.

## 2022-07-05 NOTE — Telephone Encounter (Signed)
Called and spoke to patient and she is agreeable to Split study. She is aware PCCs will authorize with insurance and will call her with a time and date for study.  Nothing further needed

## 2022-07-09 ENCOUNTER — Encounter: Payer: Self-pay | Admitting: Podiatry

## 2022-07-09 ENCOUNTER — Ambulatory Visit (INDEPENDENT_AMBULATORY_CARE_PROVIDER_SITE_OTHER): Payer: Medicaid Other | Admitting: Podiatry

## 2022-07-09 DIAGNOSIS — B351 Tinea unguium: Secondary | ICD-10-CM | POA: Diagnosis not present

## 2022-07-09 MED ORDER — TERBINAFINE HCL 250 MG PO TABS: 250.0000 mg | ORAL_TABLET | Freq: Every day | ORAL | 0 refills | Status: DC

## 2022-07-09 NOTE — Telephone Encounter (Signed)
Per Dr. Halford Chessman, Pt has had 4 unsuccessful HSTs & has ordered an in-lab study.  Pt has been made aware.  Nothing further needed at this time.

## 2022-07-09 NOTE — Progress Notes (Signed)
  Subjective:  Patient ID: Mindy [name], female    DOB: 10/12/1975,  MRN: [id number]  Chief Complaint  Patient presents with   Nail Problem    Thick painful toenails, 4 month follow up     46 y.o. female presents with the above complaint. History confirmed with patient.  She returns for follow-up she completed the medication  Objective:  Physical Exam: warm, good capillary refill, no trophic changes or ulcerative lesions, normal DP and PT pulses, normal sensory exam, and mycotic dystrophic discolored and disfigured toenails x10 bilateral.  I do not see much clinical improvement         Assessment:   1. Onychomycosis      Plan:  Patient was evaluated and treated and all questions answered.  So far do not seem significant clinical improvement.  She inquired about a second round of the Lamisil and I think this is fine to try this, discussed with her if we do not see improvement within 14-month that likely will not be changing.  We discussed total permanent matricectomy of the right toenails as well.  This would only be on an as-needed basis, otherwise pedicures and routine maintenance would be my recommendation.Rx for refill sent to pharmacy she will follow-up me in as needed  Return if symptoms worsen or fail to improve.

## 2022-07-28 IMAGING — MG DIGITAL SCREENING BILAT W/ CAD
6 series · 6 of 6 positions shown · non-contrast
Comparison: None.

CLINICAL DATA: Screening.

EXAM:
DIGITAL SCREENING BILATERAL MAMMOGRAM WITH CAD
TECHNIQUE: Bilateral screening digital craniocaudal and mediolateral oblique
mammograms were obtained. The images were evaluated with
computer-aided detection.

[R MLO (1 of 2)]
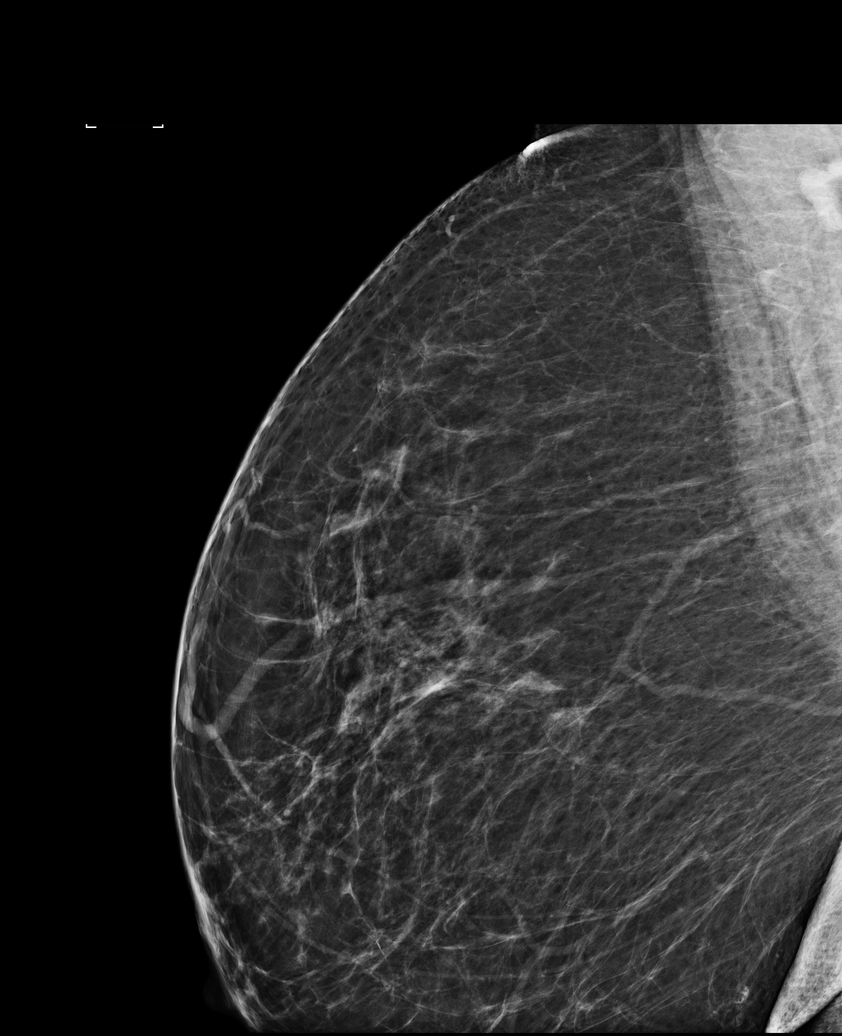

[L MLO (1 of 2)]
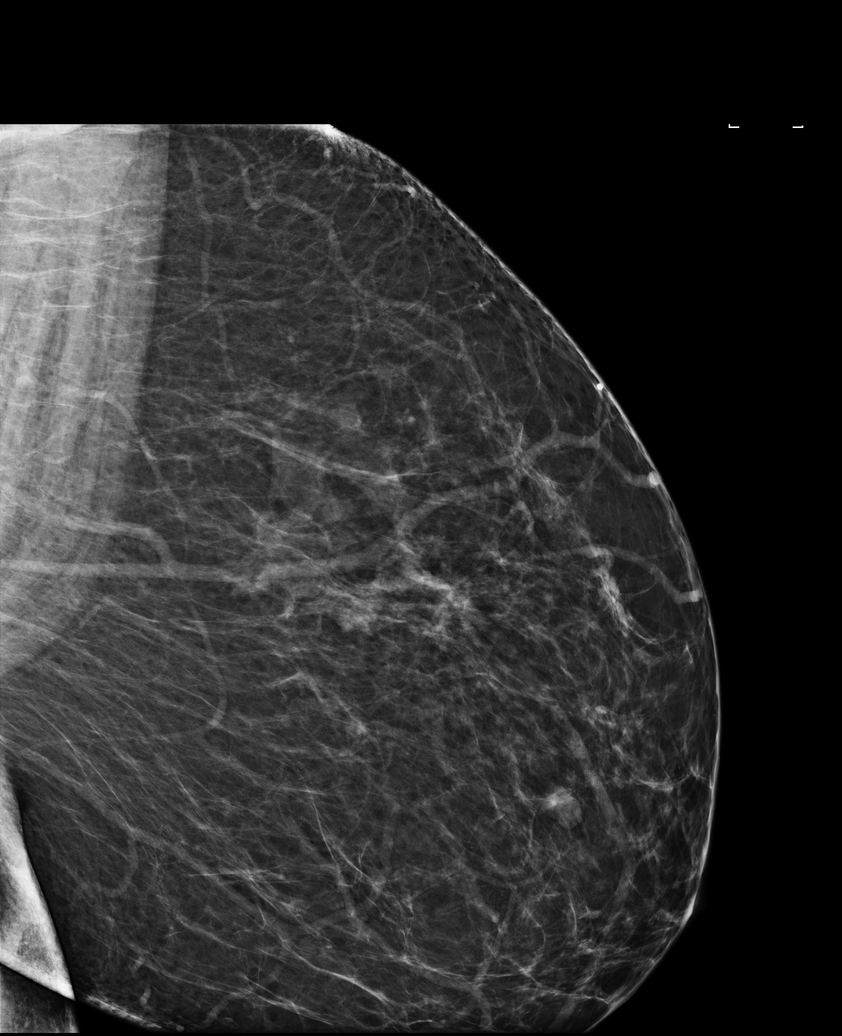

[R CC]
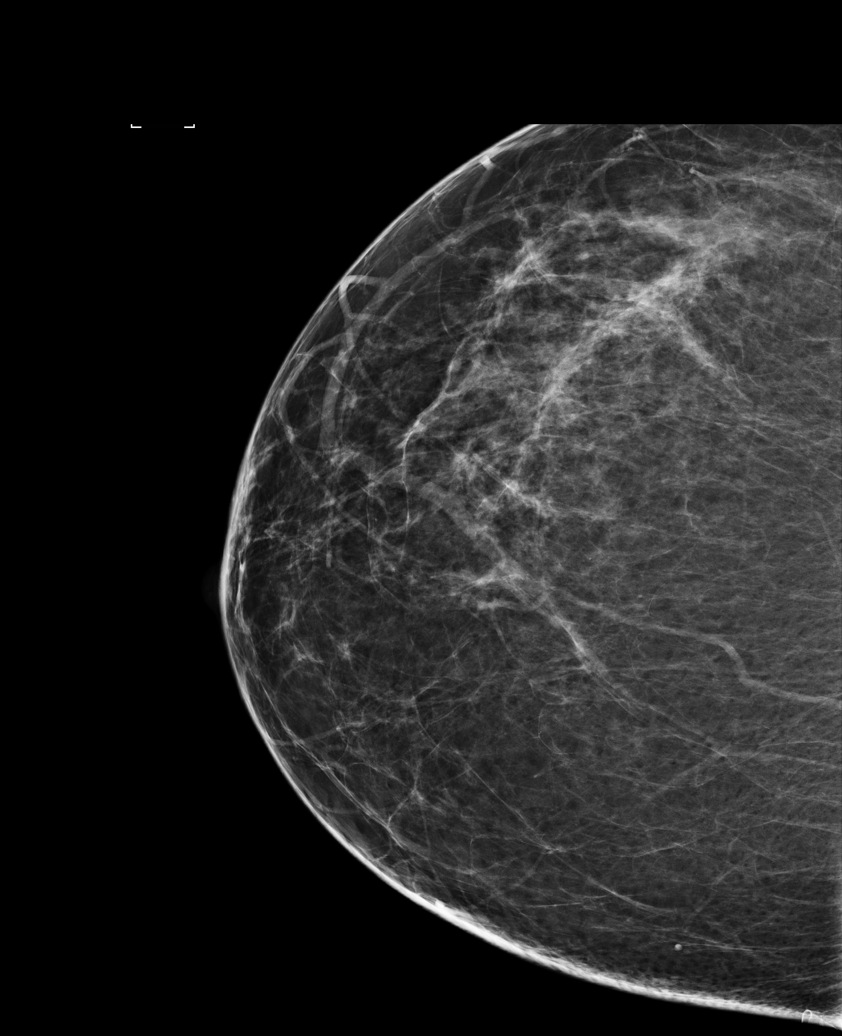

[R MLO (2 of 2)]
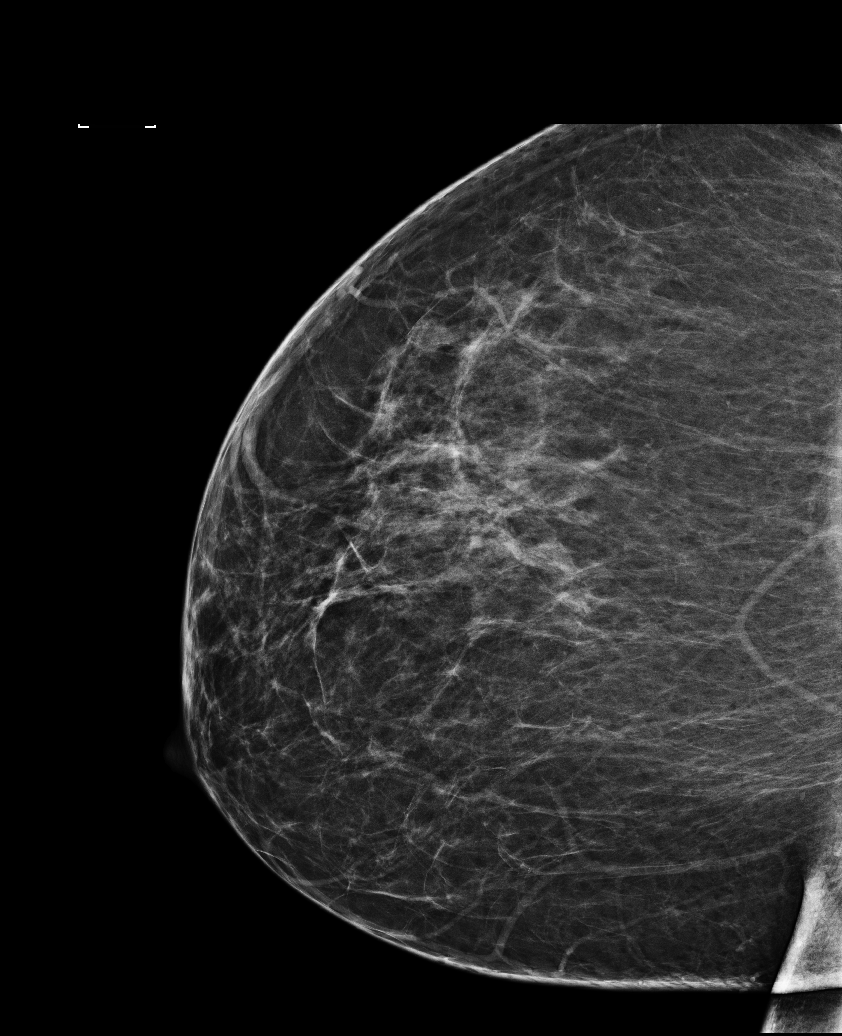

[L MLO (2 of 2)]
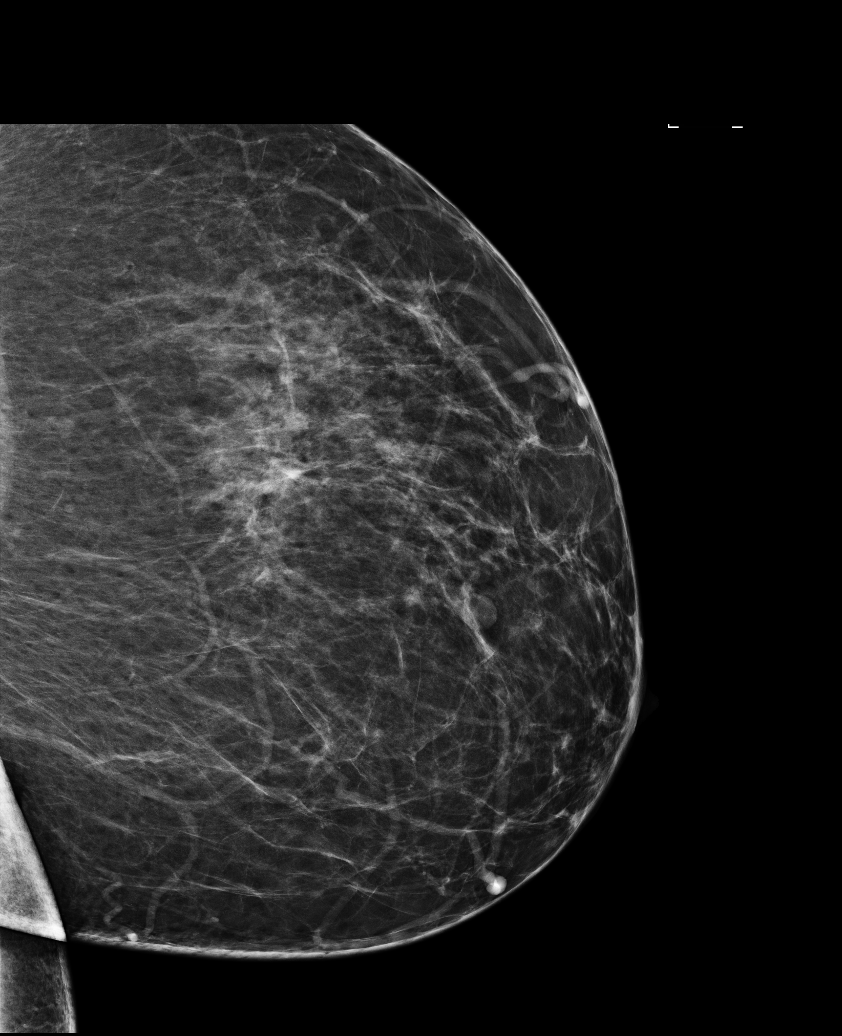

[L CC]
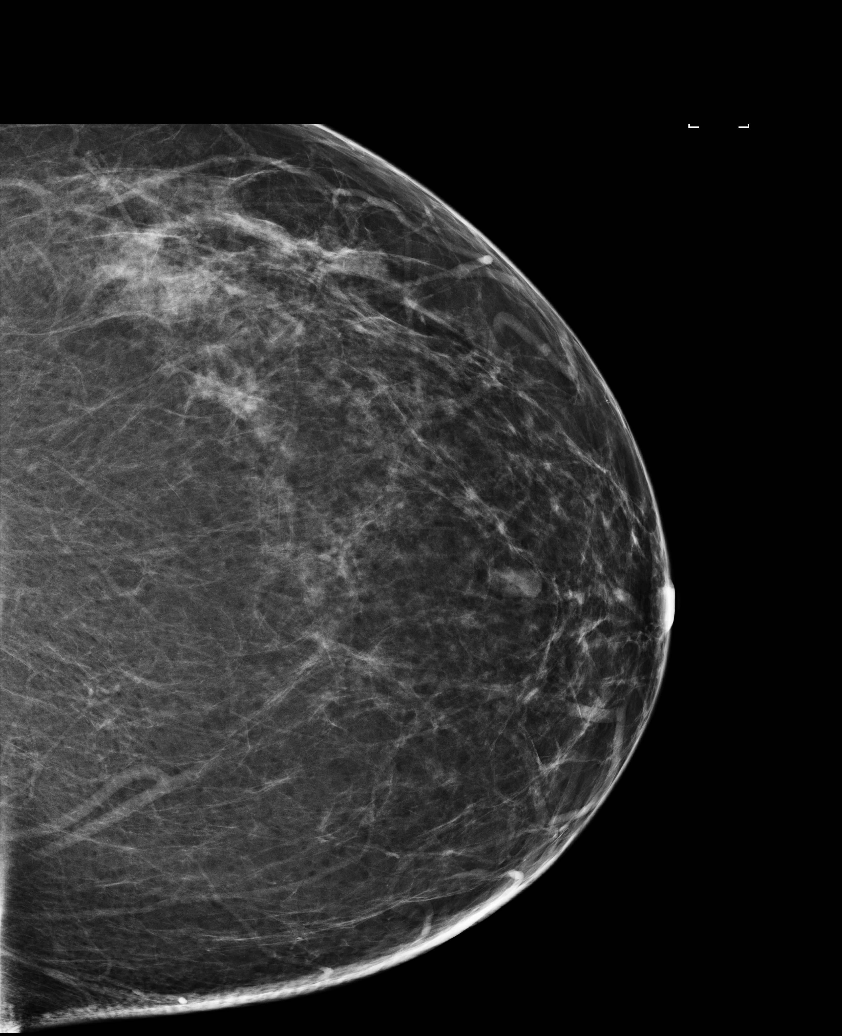

[6 of 6 positions shown; findings below may reference images not displayed]

ACR Breast Density Category b: There are scattered areas of
fibroglandular density.
FINDINGS: In the right breast an asymmetry requires further evaluation.

In the left breast a mass requires further evaluation.
IMPRESSION: Further evaluation is suggested for possible asymmetry in the right
breast.

Further evaluation is suggested for possible mass in the left
breast.

RECOMMENDATION:
Diagnostic mammogram and possibly ultrasound of both breasts.
(Code:[id number]-[id number])

The patient will be contacted regarding the findings, and additional
imaging will be scheduled.

BI-RADS CATEGORY  0: Incomplete. Need additional imaging evaluation
and/or prior mammograms for comparison.

## 2022-08-11 ENCOUNTER — Other Ambulatory Visit: Payer: Self-pay | Admitting: Podiatry

## 2022-08-19 IMAGING — US US BREAST*L* LIMITED INC AXILLA
1 series · 13 of 16 positions shown · non-contrast
Comparison: Baseline mammogram 12/05/2020.

CLINICAL DATA: Recall from baseline 2D screening mammography,
possible mass involving the LEFT breast and a possible one-view
asymmetry in the UPPER RIGHT breast visible only on the MLO image.

EXAM:
DIGITAL DIAGNOSTIC BILATERAL MAMMOGRAM WITH TOMOSYNTHESIS AND CAD;
ULTRASOUND LEFT BREAST LIMITED
TECHNIQUE: Bilateral digital diagnostic mammography and breast tomosynthesis
was performed. The images were evaluated with computer-aided
detection.; Targeted ultrasound examination of the left breast was
performed

[Series 1: us breast*left* limited inc axilla · 0.09mm/px · 13 of 16 slices shown]
[im 1/16]
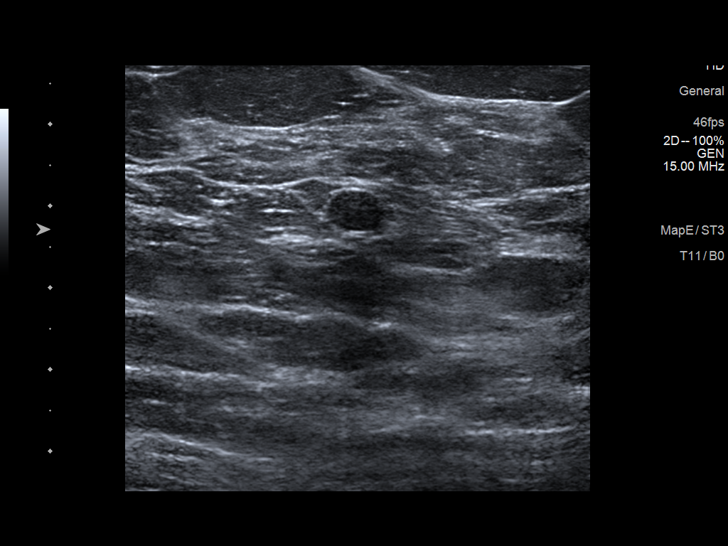
[im 2/16]
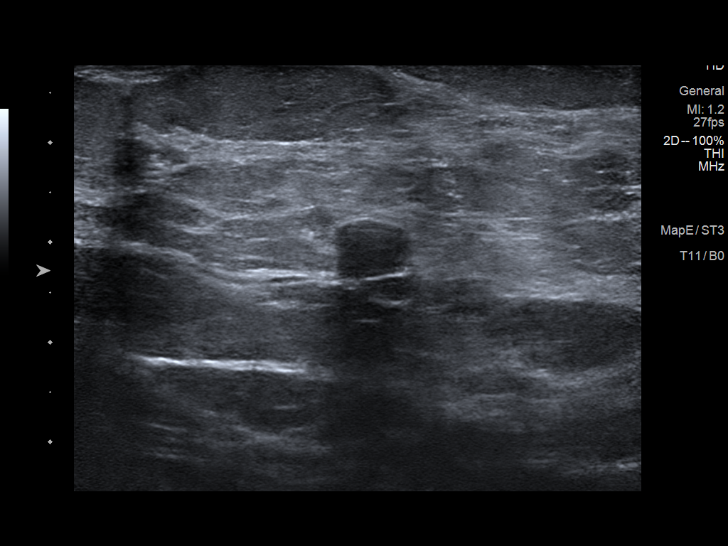
[im 4/16]
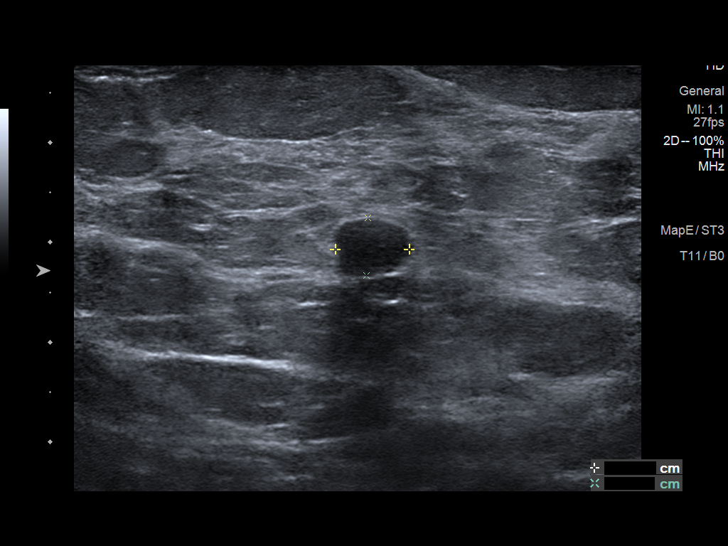
[im 5/16]
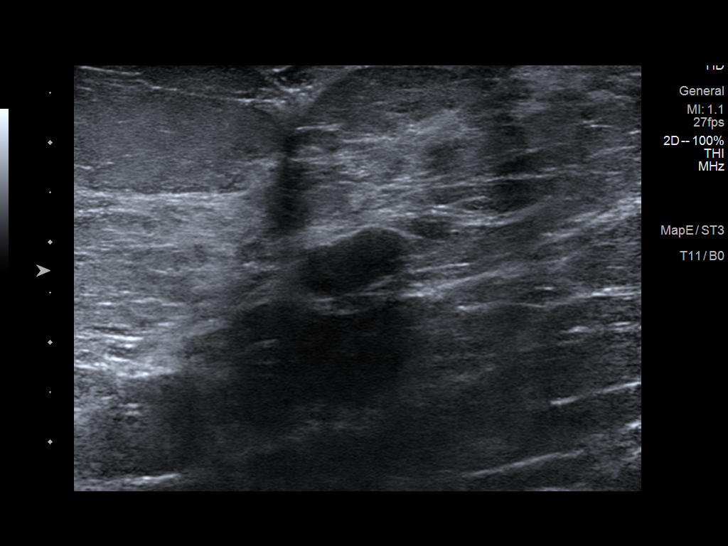
[im 6/16]
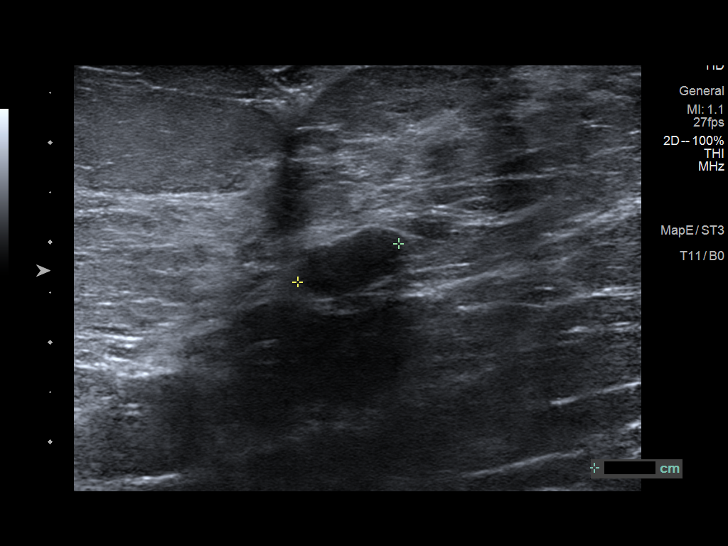
[im 7/16]
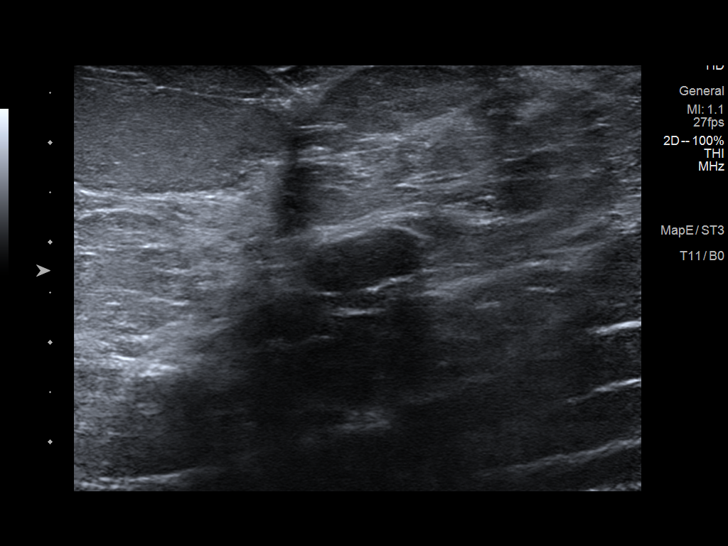
[im 9/16]
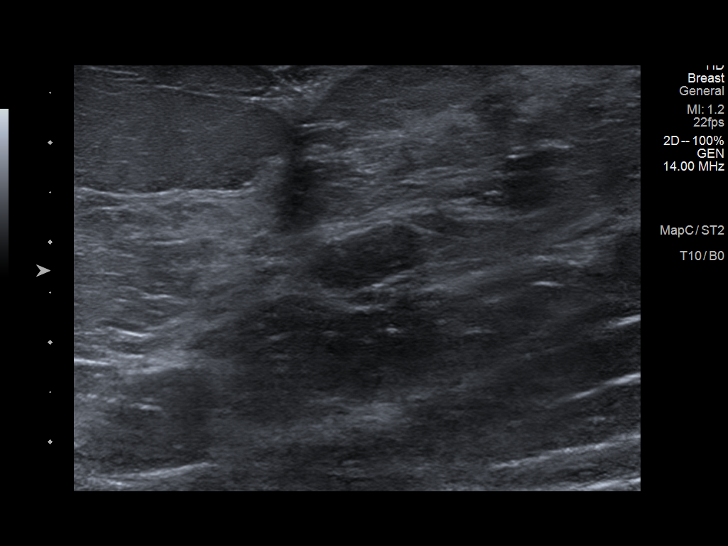
[im 10/16]
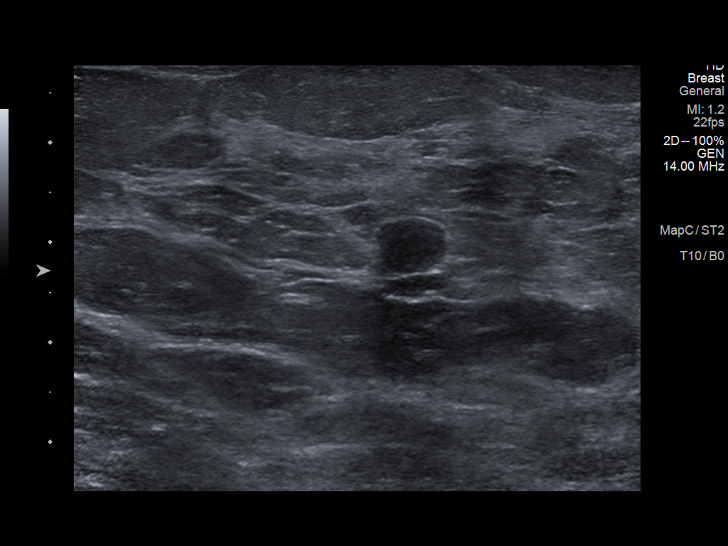
[im 11/16]
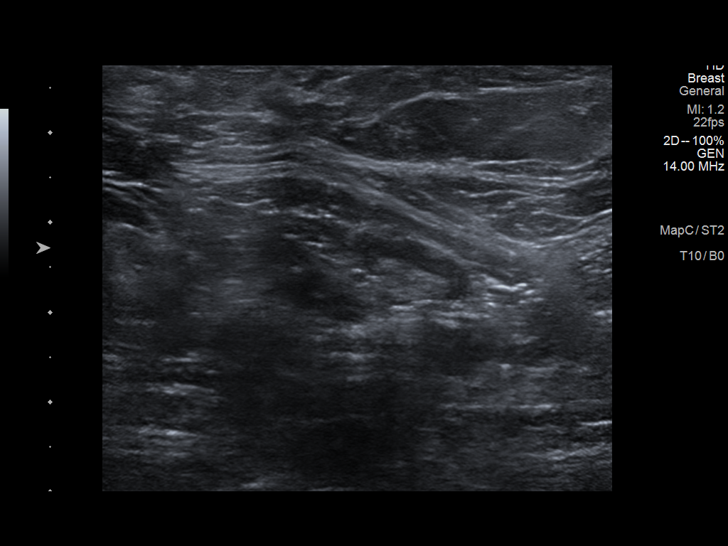
[im 12/16]
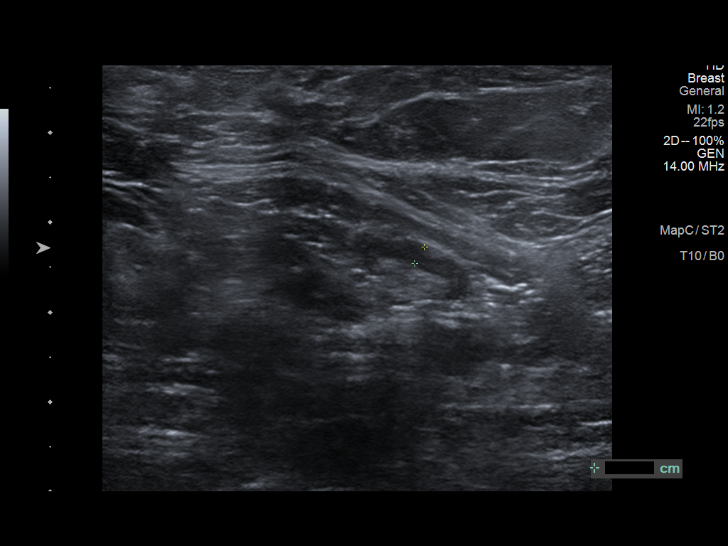
[im 13/16]
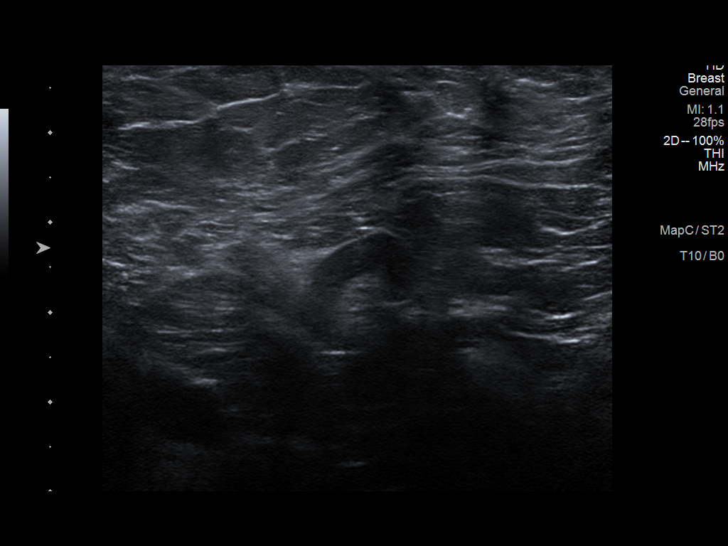
[im 15/16]
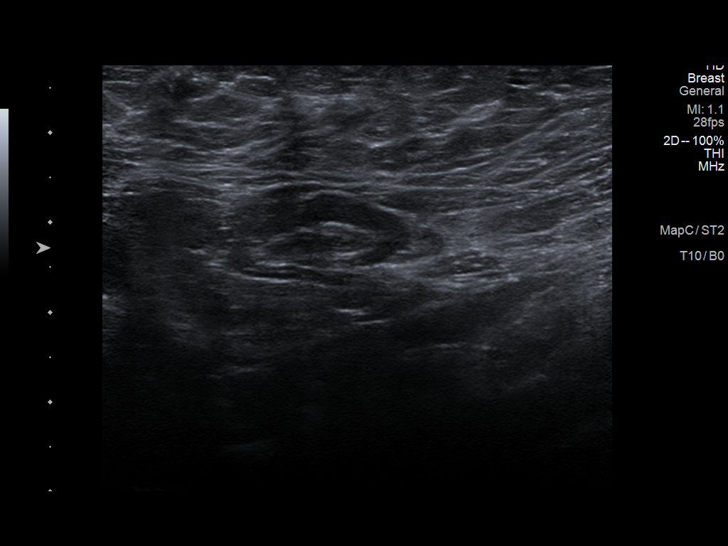
[im 16/16]
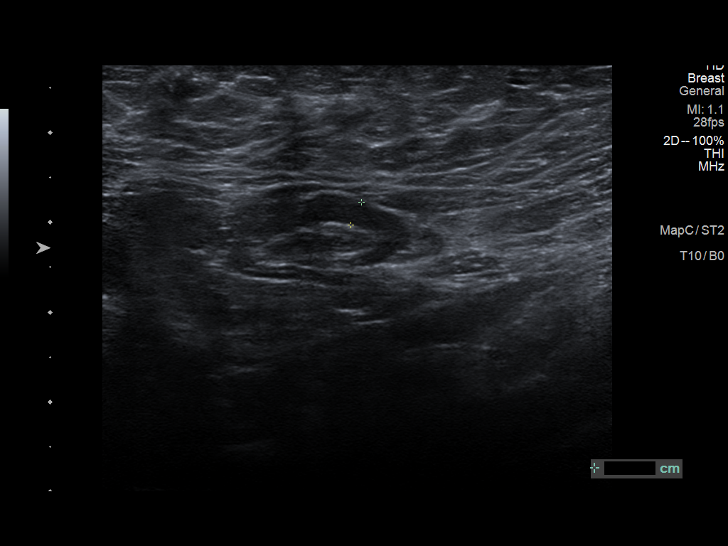

[13 of 16 positions shown; findings below may reference images not displayed]

No prior ultrasound.

ACR Breast Density Category b: There are scattered areas of
fibroglandular density.
FINDINGS: Spot-compression MLO view of the area of concern in the RIGHT breast
and a full field mediolateral view of the RIGHT breast were
obtained. Spot compression CC and MLO views of the area of concern
in the LEFT breast were obtained.

RIGHT: The asymmetry in the upper breast at middle depth questioned
on screening mammography disperses with compression, indicating
overlapping fibroglandular tissue. There is no underlying mass or
architectural distortion.

No findings suspicious for malignancy.

LEFT: Spot compression images confirm an oval circumscribed isodense
mass measuring maximally approximately 1.2 cm in the upper breast at
anterior depth, directly behind the nipple on the CC images,
therefore at or near the 12 o'clock location. There is no associated
architectural distortion or suspicious calcifications.

Targeted ultrasound is performed, demonstrating an oval
circumscribed parallel hypoechoic mass at 12 o'clock position
approximately 1 cm from the nipple measuring approximately 1.1 x
x 0.7 cm, demonstrating mixed posterior characteristics and no
internal power Doppler flow, corresponding to the screening
mammographic finding.

Sonographic evaluation of the LEFT axilla demonstrates no pathologic
lymphadenopathy.
IMPRESSION: 1. Likely benign 1.1 cm mass involving the upper LEFT breast at the
12 o'clock position approximately 1 cm from nipple, likely a
fibroadenoma.
2. No mammographic evidence of malignancy involving the RIGHT
breast.
3. No pathologic LEFT axillary lymphadenopathy.

RECOMMENDATION:
We discussed management options for the likely benign fibroadenoma
including excision, ultrasound-guided core biopsy, and short term
interval follow-up. Follow-up ultrasound is recommended at 6, 12 and
24 months to assess stability. The patient agrees with this plan.

I have discussed the findings and recommendations with the patient.
If applicable, a reminder letter will be sent to the patient
regarding the next appointment.

BI-RADS CATEGORY  3: Probably benign.

## 2022-08-27 ENCOUNTER — Ambulatory Visit (HOSPITAL_BASED_OUTPATIENT_CLINIC_OR_DEPARTMENT_OTHER): Payer: Medicaid Other | Attending: Pulmonary Disease | Admitting: Pulmonary Disease

## 2022-08-27 DIAGNOSIS — I10 Essential (primary) hypertension: Secondary | ICD-10-CM | POA: Insufficient documentation

## 2022-08-27 DIAGNOSIS — R0683 Snoring: Secondary | ICD-10-CM | POA: Insufficient documentation

## 2022-08-27 DIAGNOSIS — E669 Obesity, unspecified: Secondary | ICD-10-CM | POA: Insufficient documentation

## 2022-09-02 ENCOUNTER — Telehealth: Payer: Self-pay | Admitting: Pulmonary Disease

## 2022-09-02 DIAGNOSIS — R0683 Snoring: Secondary | ICD-10-CM | POA: Diagnosis not present

## 2022-09-02 NOTE — Telephone Encounter (Signed)
PSG 08/27/22 >> AHI 20.3, SpO2 low 88%   Please inform her that her sleep study shows moderate obstructive sleep apnea.  Please arrange for ROV with me or Tammy Parrett to discuss treatment options.

## 2022-09-02 NOTE — Procedures (Signed)
     Patient Name: Mindy [name], Mindy [name] Date: 08/27/2022 Gender: Female D.O.B: [birth year] Age (years): 74 Referring Provider: Coralyn Helling MD, ABSM Height (inches): 66 Interpreting Physician: Coralyn Helling MD, ABSM Weight (lbs): 245 RPSGT: Lowry Ram BMI: 40 MRN: [id number] Neck Size: 15.00  CLINICAL INFORMATION Sleep Study Type: NPSG  Indication for sleep study: Hypertension, Obesity, Snoring  Epworth Sleepiness Score: 15  SLEEP STUDY TECHNIQUE As per the AASM Manual for the Scoring of Sleep and Associated Events v2.3 (April 2016) with a hypopnea requiring 4% desaturations.  The channels recorded and monitored were frontal, central and occipital EEG, electrooculogram (EOG), submentalis EMG (chin), nasal and oral airflow, thoracic and abdominal wall motion, anterior tibialis EMG, snore microphone, electrocardiogram, and pulse oximetry.  MEDICATIONS Medications self-administered by patient taken the night of the study : N/A  SLEEP ARCHITECTURE The study was initiated at 10:48:27 PM and ended at 5:38:30 AM.  Sleep onset time was 26.6 minutes and the sleep efficiency was 68.5%. The total sleep time was 281 minutes.  Stage REM latency was N/A minutes.  The patient spent 17.97% of the night in stage N1 sleep, 82.03% in stage N2 sleep, 0.00% in stage N3 and 0% in REM.  Alpha intrusion was absent.  Supine sleep was 22.24%.  RESPIRATORY PARAMETERS The overall apnea/hypopnea index (AHI) was 20.3 per hour. There were 23 total apneas, including 23 obstructive, 0 central and 0 mixed apneas. There were 72 hypopneas and 24 RERAs.  The AHI during Stage REM sleep was N/A per hour.  AHI while supine was 28.8 per hour.  The mean oxygen saturation was 92.76%. The minimum SpO2 during sleep was 88.00%.  Loud snoring was noted during this study. She did not meet split night study criteria due to insufficient events in the first portion of the study.  CARDIAC DATA The 2 lead  EKG demonstrated sinus rhythm. The mean heart rate was 96.16 beats per minute. Other EKG findings include: None.  LEG MOVEMENT DATA The total PLMS were 0 with a resulting PLMS index of 0.00. Associated arousal with leg movement index was 0.0 .  IMPRESSIONS - Moderate obstructive sleep apnea with an AHI of 20.3 and SpO2 low of 88%. - occurred during this study (AHI = 20.3/h).  DIAGNOSIS - Obstructive Sleep Apnea (G47.33)  RECOMMENDATIONS - Additional therapies include weight loss, CPAP, oral appliance, or surgical assessment. - Positional therapy avoiding supine position during sleep. - Avoid alcohol, sedatives and other CNS depressants that may worsen sleep apnea and disrupt normal sleep architecture. - Sleep hygiene should be reviewed to assess factors that may improve sleep quality.  [Electronically signed] 09/02/2022 08:28 PM  Coralyn Helling MD, ABSM Diplomate, American Board of Sleep Medicine NPI: [id number]  [facility] PH: [phone]   FX: [phone] ACCREDITED BY THE AMERICAN ACADEMY OF SLEEP MEDICINE

## 2022-09-03 NOTE — Telephone Encounter (Signed)
Spoke with patient regarding HST results. They verbalized understanding. No further questions. Scheduled pt for f/u ov with TP NP Patient asked that we update her address in her chart to [address] [place] [address]. Address updated and appt reminder mailed. Nothing further needed.

## 2022-09-24 ENCOUNTER — Encounter: Payer: Self-pay | Admitting: Adult Health

## 2022-09-24 ENCOUNTER — Ambulatory Visit (INDEPENDENT_AMBULATORY_CARE_PROVIDER_SITE_OTHER): Payer: Medicaid Other | Admitting: Adult Health

## 2022-09-24 VITALS — BP 130/62 | HR 107 | Temp 98.3°F | Ht 66.0 in | Wt 245.6 lb

## 2022-09-24 DIAGNOSIS — G4733 Obstructive sleep apnea (adult) (pediatric): Secondary | ICD-10-CM | POA: Insufficient documentation

## 2022-09-24 DIAGNOSIS — G4719 Other hypersomnia: Secondary | ICD-10-CM | POA: Diagnosis not present

## 2022-09-24 DIAGNOSIS — R0683 Snoring: Secondary | ICD-10-CM | POA: Diagnosis not present

## 2022-09-24 DIAGNOSIS — Z6839 Body mass index (BMI) 39.0-39.9, adult: Secondary | ICD-10-CM

## 2022-09-24 NOTE — Patient Instructions (Addendum)
Begin CPAP At bedtime, wear all night long for at least 6hr or more.  Healthy sleep regimen  Do not drive if sleepy Work on healthy weight Saline nasal spray Twice daily   Saline nasal gel At bedtime   Follow-up in 3 months and As needed

## 2022-09-24 NOTE — Assessment & Plan Note (Signed)
Healthy weight loss discussed in detail 

## 2022-09-24 NOTE — Progress Notes (Signed)
@Patient  ID: Mindy [name], female    DOB: [birth year], 47 y.o.   MRN: [id number]  Chief Complaint  Patient presents with   Follow-up    Referring provider: [facility]  HPI: 47 year old female seen for sleep consult March 22, 2022 for snoring and daytime sleepiness found to have moderate sleep apnea  TEST/EVENTS :  PSG 08/27/22 >> AHI 20.3, SpO2 low 88%   09/24/2022 Follow up : OSA Patient presents for a follow-up visit.  Patient was seen in July 2023 for snoring and daytime sleepiness.  She was set up for a sleep study that was completed August 27, 2022.  This showed moderate sleep apnea with AHI 20.3/hour and SpO2 low at 88%.  We discussed her sleep study results in detail and went over treatment options.  Patient would like to proceed with CPAP therapy.  She has significant symptom burden and is tired of feeling fatigued throughout the day.   Allergies  Allergen Reactions   Morphine    Morphine And Related Itching   Latex Rash    Immunization History  Administered Date(s) Administered   Tdap 03/06/2016    Past Medical History:  Diagnosis Date   Hypertension     Tobacco History: Social History   Tobacco Use  Smoking Status Every Day   Packs/day: 0.25   Types: Cigarettes  Smokeless Tobacco Never  Tobacco Comments   Smoking 1/2 ppd.  Trying to slow down.  Has nicotine patches, has not started yet.  03/22/2022 hfb   Ready to quit: Not Answered Counseling given: Not Answered Tobacco comments: Smoking 1/2 ppd.  Trying to slow down.  Has nicotine patches, has not started yet.  03/22/2022 hfb   Outpatient Medications Prior to Visit  Medication Sig Dispense Refill   ADDERALL XR 20 MG 24 hr capsule Take 20 mg by mouth every morning.     albuterol (VENTOLIN HFA) 108 (90 Base) MCG/ACT inhaler Inhale 2 puffs into the lungs every 6 (six) hours as needed for wheezing or shortness of breath. 6.7 g 0   amLODipine (NORVASC) 10 MG tablet Take 10 mg by mouth  daily.     amphetamine-dextroamphetamine (ADDERALL) 10 MG tablet Take 10 mg by mouth daily.     busPIRone (BUSPAR) 7.5 MG tablet Take 7.5 mg by mouth 3 (three) times daily as needed.     clindamycin (CLEOCIN T) 1 % external solution Apply topically 2 (two) times daily. 30 mL 0   escitalopram (LEXAPRO) 20 MG tablet Take 20 mg by mouth daily.     losartan (COZAAR) 25 MG tablet Take 1 tablet (25 mg total) by mouth daily. 30 tablet 5   terbinafine (LAMISIL) 250 MG tablet TAKE 1 TABLET BY MOUTH EVERY DAY 30 tablet 2   traZODone (DESYREL) 50 MG tablet Take 50 mg by mouth at bedtime.     doxepin (SINEQUAN) 10 MG capsule Take 10 mg by mouth at bedtime. (Patient not taking: Reported on 09/24/2022)     No facility-administered medications prior to visit.     Review of Systems:   Constitutional:   No  weight loss, night sweats,  Fevers, chills,  +fatigue, or  lassitude.  HEENT:   No headaches,  Difficulty swallowing,  Tooth/dental problems, or  Sore throat,                No sneezing, itching, ear ache, nasal congestion, post nasal drip,   CV:  No chest pain,  Orthopnea, PND, swelling in  lower extremities, anasarca, dizziness, palpitations, syncope.   GI  No heartburn, indigestion, abdominal pain, nausea, vomiting, diarrhea, change in bowel habits, loss of appetite, bloody stools.   Resp: No shortness of breath with exertion or at rest.  No excess mucus, no productive cough,  No non-productive cough,  No coughing up of blood.  No change in color of mucus.  No wheezing.  No chest wall deformity  Skin: no rash or lesions.  GU: no dysuria, change in color of urine, no urgency or frequency.  No flank pain, no hematuria   MS:  No joint pain or swelling.  No decreased range of motion.  No back pain.    Physical Exam  BP 130/62 (BP Location: Left Arm, Patient Position: Sitting, Cuff Size: Large)   Pulse (!) 107   Temp 98.3 F (36.8 C) (Oral)   Ht 5\' 6"  (1.676 m)   Wt 245 lb 9.6 oz (111.4 kg)    SpO2 99%   BMI 39.64 kg/m   GEN: A/Ox3; pleasant , NAD, well nourished    HEENT:  Early/AT,  EACs-clear, TMs-wnl, NOSE-clear, THROAT-clear, no lesions, no postnasal drip or exudate noted.  Class III MP airway  NECK:  Supple w/ fair ROM; no JVD; normal carotid impulses w/o bruits; no thyromegaly or nodules palpated; no lymphadenopathy.    RESP  Clear  P & A; w/o, wheezes/ rales/ or rhonchi. no accessory muscle use, no dullness to percussion  CARD:  RRR, no m/r/g, no peripheral edema, pulses intact, no cyanosis or clubbing.  GI:   Soft & nt; nml bowel sounds; no organomegaly or masses detected.   Musco: Warm bil, no deformities or joint swelling noted.   Neuro: alert, no focal deficits noted.    Skin: Warm, no lesions or rashes    Lab Results:   BMET  BNP  Imaging: SLEEP STUDY DOCUMENTS  Result Date: 09/03/2022 Ordered by an unspecified provider.         No data to display          No results found for: "NITRICOXIDE"      Assessment & Plan:   OSA (obstructive sleep apnea) Moderate obstructive sleep apnea.  Patient education was given on CPAP.  Patient treatment options were reviewed in detail.  Patient would like to proceed with CPAP therapy.  Will begin auto CPAP 5 to 15 cm H2O.  Plan  Patient Instructions  Begin CPAP At bedtime, wear all night long for at least 6hr or more.  Healthy sleep regimen  Do not drive if sleepy Work on healthy weight Saline nasal spray Twice daily   Saline nasal gel At bedtime   Follow-up in 3 months and As needed      BMI 39.0-39.9,adult Healthy weight loss discussed in detail    Rexene Edison, NP 09/24/2022

## 2022-09-24 NOTE — Assessment & Plan Note (Signed)
Moderate obstructive sleep apnea.  Patient education was given on CPAP.  Patient treatment options were reviewed in detail.  Patient would like to proceed with CPAP therapy.  Will begin auto CPAP 5 to 15 cm H2O.  Plan  Patient Instructions  Begin CPAP At bedtime, wear all night long for at least 6hr or more.  Healthy sleep regimen  Do not drive if sleepy Work on healthy weight Saline nasal spray Twice daily   Saline nasal gel At bedtime   Follow-up in 3 months and As needed

## 2022-09-25 NOTE — Progress Notes (Signed)
Reviewed and agree with assessment/plan.   Lorik Guo, MD [place] 09/25/2022, 10:36 AM Pager:  [phone]  

## 2022-12-24 ENCOUNTER — Ambulatory Visit (INDEPENDENT_AMBULATORY_CARE_PROVIDER_SITE_OTHER): Payer: Medicaid Other | Admitting: Adult Health

## 2022-12-24 ENCOUNTER — Encounter: Payer: Self-pay | Admitting: Adult Health

## 2022-12-24 VITALS — BP 140/80 | HR 95 | Temp 98.2°F | Ht 66.0 in | Wt 250.0 lb

## 2022-12-24 DIAGNOSIS — G4733 Obstructive sleep apnea (adult) (pediatric): Secondary | ICD-10-CM | POA: Diagnosis not present

## 2022-12-24 NOTE — Patient Instructions (Signed)
Continue on CPAP At bedtime, wear all night long for at least 6hr or more.  Healthy sleep regimen  Do not drive if sleepy Work on healthy weight Saline nasal spray Twice daily   Saline nasal gel At bedtime   Follow-up in 6 months and As needed

## 2022-12-24 NOTE — Progress Notes (Unsigned)
  ID: Mindy [name], female    DOB: [birth year], 47 y.o.   MRN: [id number]  Chief Complaint  Patient presents with   Follow-up    Referring provider: [facility]  HPI: 47 year old female seen for sleep consult March 22, 2022 for snoring and daytime sleepiness found to have moderate obstructive sleep apnea  TEST/EVENTS :  PSG 08/27/22 >> AHI 20.3, SpO2 low 88%   12/24/2022 Follow up: OSA  Patient returns for a 59-month follow-up.  Patient was diagnosed with moderate obstructive sleep apnea started on CPAP therapy.  Patient says she is starting to feel better has more energy and feels that she is sleeping better.  Still does not get as much sleep as she would like.  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  She is currently using a nasal mask.  Feels that this is more comfortable for her.  CPAP download shows excellent compliance with 85% usage.  Daily average usage at 5.5 hours.  Patient is on auto CPAP 5 to 15 cm H2O. Daily average pressure at 11.7 cm H2O.  AHI 0.9/hour    Allergies  Allergen Reactions   Morphine    Morphine And Related Itching   Latex Rash    Immunization History  Administered Date(s) Administered   Tdap 03/06/2016    Past Medical History:  Diagnosis Date   Hypertension     Tobacco History: Social History   Tobacco Use  Smoking Status Every Day   Packs/day: .25   Types: Cigarettes  Smokeless Tobacco Never  Tobacco Comments   Smoking 1/2 ppd.  Trying to slow down.  Has nicotine patches, has not started yet.  03/22/2022 hfb   Ready to quit: Not Answered Counseling given: Not Answered Tobacco comments: Smoking 1/2 ppd.  Trying to slow down.  Has nicotine patches, has not started yet.  03/22/2022 hfb   Outpatient Medications Prior to Visit  Medication Sig Dispense Refill   ADDERALL XR 20 MG 24 hr capsule Take 20  mg by mouth every morning.     albuterol (VENTOLIN HFA) 108 (90 Base) MCG/ACT inhaler Inhale 2 puffs into the lungs every 6 (six) hours as needed for wheezing or shortness of breath. 6.7 g 0   amLODipine (NORVASC) 10 MG tablet Take 10 mg by mouth daily.     amphetamine-dextroamphetamine (ADDERALL) 10 MG tablet Take 10 mg by mouth daily.     busPIRone (BUSPAR) 7.5 MG tablet Take 7.5 mg by mouth 3 (three) times daily as needed.     clindamycin (CLEOCIN T) 1 % external solution Apply topically 2 (two) times daily. 30 mL 0   escitalopram (LEXAPRO) 20 MG tablet Take 20 mg by mouth daily.     losartan (COZAAR) 25 MG tablet Take 1 tablet (25 mg total) by mouth daily. 30 tablet 5   terbinafine (LAMISIL) 250 MG tablet TAKE 1 TABLET BY MOUTH EVERY DAY 30 tablet 2   traZODone (DESYREL) 50 MG tablet Take 50 mg by mouth at bedtime.     doxepin (SINEQUAN) 10 MG capsule Take 10 mg by mouth at bedtime. (Patient not taking: Reported on 09/24/2022)     No facility-administered medications prior to visit.     Review of Systems:   Constitutional:   No  weight loss, night sweats,  Fevers, chills, fatigue, or  lassitude.  HEENT:  No headaches,  Difficulty swallowing,  Tooth/dental problems, or  Sore throat,                No sneezing, itching, ear ache, nasal congestion, post nasal drip,   CV:  No chest pain,  Orthopnea, PND, swelling in lower extremities, anasarca, dizziness, palpitations, syncope.   GI  No heartburn, indigestion, abdominal pain, nausea, vomiting, diarrhea, change in bowel habits, loss of appetite, bloody stools.   Resp: No shortness of breath with exertion or at rest.  No excess mucus, no productive cough,  No non-productive cough,  No coughing up of blood.  No change in color of mucus.  No wheezing.  No chest wall deformity  Skin: no rash or lesions.  GU: no dysuria, change in color of urine, no urgency or frequency.  No flank pain, no hematuria   MS:  No joint pain or swelling.  No  decreased range of motion.  No back pain.    Physical Exam    GEN: A/Ox3; pleasant , NAD, well nourished    HEENT:  Lupus/AT,  HROAT-clear, no lesions, no postnasal drip or exudate noted.   NECK:  Supple w/ fair ROM; no JVD; normal carotid impulses w/o bruits; no thyromegaly or nodules palpated; no lymphadenopathy.    RESP  Clear  P & A; w/o, wheezes/ rales/ or rhonchi. no accessory muscle use, no dullness to percussion  CARD:  RRR, no m/r/g, no peripheral edema, pulses intact, no cyanosis or clubbing.  GI:   Soft & nt; nml bowel sounds; no organomegaly or masses detected.   Musco: Warm bil, no deformities or joint swelling noted.   Neuro: alert, no focal deficits noted.    Skin: Warm, no lesions or rashes    Lab Results:      BNP  ProBNP No results found for: "PROBNP"  Imaging: No results found.        No data to display          No results found for: "NITRICOXIDE"      Assessment & Plan:   No problem-specific Assessment & Plan notes found for this encounter.     Rubye Oaks, NP 12/24/2022

## 2022-12-24 NOTE — Progress Notes (Unsigned)
No covid vaccines and no flu vaccine for 2023.

## 2022-12-26 NOTE — Assessment & Plan Note (Signed)
Moderate obstructive sleep apnea with excellent control on nocturnal CPAP.  Patient is encouraged to use her CPAP each night try to get at least 6 or more hours of sleep.  Plan  Patient Instructions  Continue on CPAP At bedtime, wear all night long for at least 6hr or more.  Healthy sleep regimen  Do not drive if sleepy Work on healthy weight Saline nasal spray Twice daily   Saline nasal gel At bedtime   Follow-up in 6 months and As needed

## 2022-12-26 NOTE — Progress Notes (Signed)
Reviewed and agree with assessment/plan.   Mackayla Mullins, MD [facility] 12/26/2022, 4:27 PM Pager:  [phone]  

## 2023-03-07 ENCOUNTER — Telehealth: Payer: Self-pay | Admitting: Adult Health

## 2023-03-07 DIAGNOSIS — G4733 Obstructive sleep apnea (adult) (pediatric): Secondary | ICD-10-CM

## 2023-03-07 NOTE — Telephone Encounter (Signed)
Pt calling in b/c she is having trouble w her CPAP machine

## 2023-03-12 NOTE — Telephone Encounter (Signed)
Called patient.  Patient states there are holes in the CPAP tubing, tears in the head straps and the mask is leaking.  Spoke with Abbott Pao, NP nurse.  Ok to put order in for CPAP supplies.  Informed patient order placed for CPAP supplies.

## 2023-06-25 ENCOUNTER — Ambulatory Visit: Payer: Medicaid Other | Admitting: Adult Health

## 2023-06-30 ENCOUNTER — Encounter: Payer: Self-pay | Admitting: Adult Health

## 2023-10-07 ENCOUNTER — Encounter: Payer: Self-pay | Admitting: Physician Assistant

## 2023-10-07 DIAGNOSIS — Z1231 Encounter for screening mammogram for malignant neoplasm of breast: Secondary | ICD-10-CM

## 2023-10-09 ENCOUNTER — Encounter: Payer: Self-pay | Admitting: Physician Assistant

## 2023-10-09 ENCOUNTER — Other Ambulatory Visit: Payer: Self-pay | Admitting: Physician Assistant

## 2023-10-09 DIAGNOSIS — N6320 Unspecified lump in the left breast, unspecified quadrant: Secondary | ICD-10-CM

## 2023-10-30 ENCOUNTER — Emergency Department (HOSPITAL_BASED_OUTPATIENT_CLINIC_OR_DEPARTMENT_OTHER)
Admission: EM | Admit: 2023-10-30 | Discharge: 2023-10-30 | Disposition: A | Discharge status: Home / Self Care | Payer: No Typology Code available for payment source | Attending: Emergency Medicine | Admitting: Emergency Medicine

## 2023-10-30 ENCOUNTER — Other Ambulatory Visit: Payer: Self-pay

## 2023-10-30 ENCOUNTER — Other Ambulatory Visit: Payer: Medicaid Other

## 2023-10-30 ENCOUNTER — Emergency Department (HOSPITAL_BASED_OUTPATIENT_CLINIC_OR_DEPARTMENT_OTHER): Payer: No Typology Code available for payment source

## 2023-10-30 ENCOUNTER — Encounter (HOSPITAL_BASED_OUTPATIENT_CLINIC_OR_DEPARTMENT_OTHER): Payer: Self-pay | Admitting: Emergency Medicine

## 2023-10-30 DIAGNOSIS — Z91040 Latex allergy status: Secondary | ICD-10-CM | POA: Insufficient documentation

## 2023-10-30 DIAGNOSIS — Y92410 Unspecified street and highway as the place of occurrence of the external cause: Secondary | ICD-10-CM | POA: Diagnosis not present

## 2023-10-30 DIAGNOSIS — M791 Myalgia, unspecified site: Secondary | ICD-10-CM | POA: Insufficient documentation

## 2023-10-30 DIAGNOSIS — M25561 Pain in right knee: Secondary | ICD-10-CM | POA: Diagnosis not present

## 2023-10-30 DIAGNOSIS — M542 Cervicalgia: Secondary | ICD-10-CM | POA: Insufficient documentation

## 2023-10-30 DIAGNOSIS — S0990XA Unspecified injury of head, initial encounter: Secondary | ICD-10-CM | POA: Diagnosis present

## 2023-10-30 DIAGNOSIS — Z79899 Other long term (current) drug therapy: Secondary | ICD-10-CM | POA: Insufficient documentation

## 2023-10-30 MED ORDER — METHOCARBAMOL 500 MG PO TABS: 500.0000 mg | ORAL_TABLET | Freq: Two times a day (BID) | ORAL | 0 refills | Status: DC
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
## 2023-10-30 NOTE — ED Notes (Signed)
 Pt given discharge instructions and reviewed prescriptions. Opportunities given for questions. Pt verbalizes understanding. Jillyn Hidden, RN
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
## 2024-02-07 ENCOUNTER — Other Ambulatory Visit: Payer: Self-pay

## 2024-02-07 ENCOUNTER — Emergency Department (HOSPITAL_BASED_OUTPATIENT_CLINIC_OR_DEPARTMENT_OTHER)

## 2024-02-07 ENCOUNTER — Emergency Department (HOSPITAL_BASED_OUTPATIENT_CLINIC_OR_DEPARTMENT_OTHER)
Admission: EM | Admit: 2024-02-07 | Discharge: 2024-02-07 | Disposition: A | Discharge status: Home / Self Care | Attending: Emergency Medicine | Admitting: Emergency Medicine

## 2024-02-07 ENCOUNTER — Encounter (HOSPITAL_BASED_OUTPATIENT_CLINIC_OR_DEPARTMENT_OTHER): Payer: Self-pay

## 2024-02-07 DIAGNOSIS — Z91040 Latex allergy status: Secondary | ICD-10-CM | POA: Insufficient documentation

## 2024-02-07 DIAGNOSIS — S99191A Other physeal fracture of right metatarsal, initial encounter for closed fracture: Secondary | ICD-10-CM | POA: Insufficient documentation

## 2024-02-07 DIAGNOSIS — X501XXA Overexertion from prolonged static or awkward postures, initial encounter: Secondary | ICD-10-CM | POA: Insufficient documentation

## 2024-02-07 DIAGNOSIS — S99921A Unspecified injury of right foot, initial encounter: Secondary | ICD-10-CM | POA: Diagnosis present

## 2024-02-07 MED ORDER — HYDROCODONE-ACETAMINOPHEN 5-325 MG PO TABS: 1.0000 | ORAL_TABLET | ORAL | 0 refills | Status: AC | PRN

## 2024-02-07 MED ORDER — HYDROCODONE-ACETAMINOPHEN 5-325 MG PO TABS
1.0000 | ORAL_TABLET | Freq: Once | ORAL | Status: AC
  Administered 2024-02-07: 1 via ORAL
  Filled 2024-02-07: qty 1

## 2024-02-07 NOTE — ED Notes (Signed)
 X-Ray at bedside.
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
## 2024-02-07 NOTE — ED Notes (Signed)
Reviewed discharge instructions, medications, and home care with pt. Pt verbalized understanding and had no further questions. Pt exited ED without complications.

## 2024-02-07 NOTE — Discharge Instructions (Signed)
 Take Norco for pain as needed. Use the crutches to be weight bearing as tolerated. Follow up with Dr. Luster Salters for foot fracture to ensure it heals properly, and with Dr. Annamae Barrett for evaluation of the finger injury.

## 2024-02-07 NOTE — ED Provider Notes (Signed)
 [place] EMERGENCY DEPARTMENT AT [facility] Provider Note   CSN: [id number] Arrival date & time: 02/07/24  1336     History  Chief Complaint  Patient presents with   Foot Pain    Mindy [name] is a 48 y.o. female.  Patient to ED with right foot injury x 1 week. She was walking and inverted her foot, applying full weight. Since then she has had difficulty walking due to pain.  The history is provided by the patient. No language interpreter was used.  Foot Pain       Home Medications Prior to Admission medications   Medication Sig Start Date End Date Taking? Authorizing Provider  HYDROcodone -acetaminophen  (NORCO/VICODIN) 5-325 MG tablet Take 1 tablet by mouth every 4 (four) hours as needed for moderate pain (pain score 4-6). 02/07/24  Yes Aveion Nguyen, PA-C  ADDERALL XR 20 MG 24 hr capsule Take 20 mg by mouth every morning. 11/08/21   [provider]  albuterol  (VENTOLIN  HFA) 108 (90 Base) MCG/ACT inhaler Inhale 2 puffs into the lungs every 6 (six) hours as needed for wheezing or shortness of breath. 04/25/22   Redwine, Madison A, PA-C  amLODipine (NORVASC) 10 MG tablet Take 10 mg by mouth daily. 07/30/21   [provider]  amphetamine-dextroamphetamine (ADDERALL) 10 MG tablet Take 10 mg by mouth daily. 11/08/21   [provider]  busPIRone (BUSPAR) 7.5 MG tablet Take 7.5 mg by mouth 3 (three) times daily as needed. 11/22/21   [provider]  clindamycin  (CLEOCIN  T) 1 % external solution Apply topically 2 (two) times daily. 03/05/22   Flynn Hylan, MD  escitalopram (LEXAPRO) 20 MG tablet Take 20 mg by mouth daily. 10/11/21   [provider]  losartan  (COZAAR ) 25 MG tablet Take 1 tablet (25 mg total) by mouth daily. 02/19/22   Bridgette Campus, MD  methocarbamol  (ROBAXIN ) 500 MG tablet Take 1 tablet (500 mg total) by mouth 2 (two) times daily. 10/30/23   Felicie Horning, PA-C  terbinafine  (LAMISIL ) 250 MG tablet TAKE 1 TABLET  BY MOUTH EVERY DAY 08/15/22   Floyce Hutching, DPM  traZODone (DESYREL) 50 MG tablet Take 50 mg by mouth at bedtime. 04/17/22   [provider]      Allergies    Morphine, Morphine and codeine, and Latex    Review of Systems   Review of Systems  Physical Exam Updated Vital Signs BP (!) 145/77 (BP Location: Right Arm)   Pulse 92   Temp 97.7 F (36.5 C)   Resp 20   LMP 01/20/2024 (Approximate)   SpO2 94%  Physical Exam Vitals and nursing note reviewed.  Constitutional:      General: She is not in acute distress.    Appearance: She is well-developed. She is not ill-appearing.  Pulmonary:     Effort: Pulmonary effort is normal.  Musculoskeletal:        General: Normal range of motion.     Cervical back: Normal range of motion.  Skin:    General: Skin is warm and dry.     Findings: Bruising (lateral right foot extending distally) present.  Neurological:     Mental Status: She is alert and oriented to person, place, and time.     Sensory: No sensory deficit.     ED Results / Procedures / Treatments   Labs (all labs ordered are listed, but only abnormal results are displayed) Labs Reviewed - No data to display  EKG None  Radiology DG Foot Complete Right Result Date: 02/07/2024 CLINICAL DATA:  Twisting injury, persistent foot pain EXAM: RIGHT FOOT COMPLETE - 3+ VIEW COMPARISON:  None Available. FINDINGS: There is an acute minimally displaced fracture of the right fifth metatarsal base laterally. No additional osseous abnormality. 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 No additional joint or soft tissue abnormality. IMPRESSION: Acute minimally displaced right fifth metatarsal base fracture. Electronically Signed   By: Melven Stable.  Shick M.D.   On: 02/07/2024 15:57    Procedures Procedures    Medications Ordered in ED Medications  HYDROcodone -acetaminophen  (NORCO/VICODIN) 5-325 MG per  tablet 1 tablet (1 tablet Oral Given 02/07/24 1637)    ED Course/ Medical Decision Making/ A&P Clinical Course as of 02/07/24 1647  Sat Feb 07, 2024  5781 Week old inversion injury of right foot. Imaging c/w Rochelle Chu' fracture. Closed injury. Post-op shoe and crutches provided. Will Rx Norco. Refer to podiatry. [SU]    Clinical Course User Index [SU] Mandy Second, PA-C                                 Medical Decision Making          Final Clinical Impression(s) / ED Diagnoses Final diagnoses:  Closed fracture of base of fifth metatarsal bone of right foot at metaphyseal-diaphyseal junction, initial encounter    Rx / DC Orders ED Discharge Orders          Ordered    HYDROcodone -acetaminophen  (NORCO/VICODIN) 5-325 MG tablet  Every 4 hours PRN        02/07/24 1644              Mandy Second, PA-C 02/07/24 1647    Tonya Fredrickson, MD 02/08/24 1108

## 2024-02-07 NOTE — ED Notes (Signed)
Pt provided with ice pack.

## 2024-04-01 ENCOUNTER — Emergency Department (HOSPITAL_BASED_OUTPATIENT_CLINIC_OR_DEPARTMENT_OTHER): Admission: EM | Admit: 2024-04-01 | Discharge: 2024-04-01 | Disposition: A | Discharge status: Home / Self Care

## 2024-04-01 ENCOUNTER — Other Ambulatory Visit: Payer: Self-pay

## 2024-04-01 ENCOUNTER — Encounter (HOSPITAL_BASED_OUTPATIENT_CLINIC_OR_DEPARTMENT_OTHER): Payer: Self-pay | Admitting: Emergency Medicine

## 2024-04-01 DIAGNOSIS — Z91040 Latex allergy status: Secondary | ICD-10-CM | POA: Insufficient documentation

## 2024-04-01 DIAGNOSIS — M25562 Pain in left knee: Secondary | ICD-10-CM | POA: Insufficient documentation

## 2024-04-01 DIAGNOSIS — M25462 Effusion, left knee: Secondary | ICD-10-CM | POA: Diagnosis not present

## 2024-04-01 DIAGNOSIS — M25561 Pain in right knee: Secondary | ICD-10-CM | POA: Insufficient documentation

## 2024-04-01 DIAGNOSIS — M25461 Effusion, right knee: Secondary | ICD-10-CM | POA: Insufficient documentation

## 2024-04-01 MED ORDER — METHOCARBAMOL 500 MG PO TABS: 500.0000 mg | ORAL_TABLET | Freq: Four times a day (QID) | ORAL | 0 refills | Status: AC | PRN

## 2024-04-01 MED ORDER — KETOROLAC TROMETHAMINE 15 MG/ML IJ SOLN
15.0000 mg | Freq: Once | INTRAMUSCULAR | Status: AC
  Administered 2024-04-01: 15 mg via INTRAMUSCULAR
  Filled 2024-04-01: qty 1

## 2024-04-01 NOTE — ED Triage Notes (Signed)
 Pt caox4 c/o increased swelling in knees bilat, R worse than the L x1 wk. Denies fall or injury.

## 2024-04-01 NOTE — ED Provider Notes (Signed)
 [place] EMERGENCY DEPARTMENT AT [facility] Provider Note   CSN: [id number] Arrival date & time: 04/01/24  1625     Patient presents with: Joint Swelling   Mindy [name] is a 48 y.o. female.   48 year old female presents for evaluation of bilateral knee swelling.  She states it comes and goes for the last few days has been worse.  States it started in the left knee and now was in the right knee.  States she has had this problem before.  States she recently broke her foot but never followed up with Ortho and since then she has had problems with her knees.  States she does have spasms just above the knee as well that sometimes get worse when she stretches. She has not taken any medication for pain.  Denies any other symptoms or concerns at this time.        Prior to Admission medications   Medication Sig Start Date End Date Taking? Authorizing Provider  methocarbamol  (ROBAXIN ) 500 MG tablet Take 1 tablet (500 mg total) by mouth 4 (four) times daily as needed for up to 7 days for muscle spasms. 04/01/24 04/08/24 Yes Kia Stavros L, DO  ADDERALL XR 20 MG 24 hr capsule Take 20 mg by mouth every morning. 11/08/21   [provider]  albuterol  (VENTOLIN  HFA) 108 (90 Base) MCG/ACT inhaler Inhale 2 puffs into the lungs every 6 (six) hours as needed for wheezing or shortness of breath. 04/25/22   Redwine, Madison A, PA-C  amLODipine (NORVASC) 10 MG tablet Take 10 mg by mouth daily. 07/30/21   [provider]  amphetamine-dextroamphetamine (ADDERALL) 10 MG tablet Take 10 mg by mouth daily. 11/08/21   [provider]  busPIRone (BUSPAR) 7.5 MG tablet Take 7.5 mg by mouth 3 (three) times daily as needed. 11/22/21   [provider]  clindamycin  (CLEOCIN  T) 1 % external solution Apply topically 2 (two) times daily. 03/05/22   Lane Shope, MD  escitalopram (LEXAPRO) 20 MG tablet Take 20 mg by mouth daily. 10/11/21   [provider]   HYDROcodone -acetaminophen  (NORCO/VICODIN) 5-325 MG tablet Take 1 tablet by mouth every 4 (four) hours as needed for moderate pain (pain score 4-6). 02/07/24   Odell Balls, PA-C  losartan  (COZAAR ) 25 MG tablet Take 1 tablet (25 mg total) by mouth daily. 02/19/22   Alvan Ronal BRAVO, MD  terbinafine  (LAMISIL ) 250 MG tablet TAKE 1 TABLET BY MOUTH EVERY DAY 08/15/22   Silva Juliene SAUNDERS, DPM  traZODone (DESYREL) 50 MG tablet Take 50 mg by mouth at bedtime. 04/17/22   [provider]    Allergies: Morphine, Morphine and codeine, and Latex    Review of Systems  Constitutional:  Negative for chills and fever.  HENT:  Negative for ear pain and sore throat.   Eyes:  Negative for pain and visual disturbance.  Respiratory:  Negative for cough and shortness of breath.   Cardiovascular:  Negative for chest pain and palpitations.  Gastrointestinal:  Negative for abdominal pain and vomiting.  Genitourinary:  Negative for dysuria and hematuria.  Musculoskeletal:  Negative for arthralgias and back pain.       Bilateral knee pain and swelling  Skin:  Negative for color change and rash.  Neurological:  Negative for seizures and syncope.  All other systems reviewed and are negative.   Updated Vital Signs BP (!) 145/91   Pulse 94   Temp 98.4 F (36.9 C)   Resp 20   LMP  03/10/2024 (Approximate)   SpO2 98%   Physical Exam  (all labs ordered are listed, but only abnormal results are displayed) Labs Reviewed - No data to display  EKG: None  Radiology: No results found.   Procedures   Medications Ordered in the ED  ketorolac  (TORADOL ) 15 MG/ML injection 15 mg (15 mg Intramuscular Given 04/01/24 1832)                                    Medical Decision Making Patient here for very mild knee swelling.  On exam she really has no swelling no erythema or signs of infection but some mild tenderness to palpation over the bilateral MCL's.  It seems like she is getting some spasms of her  quadriceps muscles.  Advise Ace wrap and knee braces as needed, and I will give her a shot of Toradol  here.  Advise Voltaren gel and Tylenol  Motrin  as needed I will give prescription for Robaxin  to use as needed for spasm.  Advised to follow-up with Ortho as she was directed last time she was here and otherwise return to the ER for new or worsening symptoms.  She feels comfortable with the plan to be discharged home.  Problems Addressed: Acute pain of both knees: acute illness or injury  Amount and/or Complexity of Data Reviewed External Data Reviewed: notes.    Details: Prior ED records reviewed and patient was here about 2 weeks ago for a foot injury and advised to follow-up with orthopedic surgery  Risk OTC drugs. Prescription drug management.     Final diagnoses:  Acute pain of both knees    ED Discharge Orders          Ordered    methocarbamol  (ROBAXIN ) 500 MG tablet  4 times daily PRN        04/01/24 1827               Saleha Kalp L, DO 04/01/24 1834

## 2024-04-01 NOTE — Discharge Instructions (Signed)
 Alternate Tylenol  and Motrin  as needed for pain.  You can take Robaxin  up to 4 times a day as needed for muscle spasms.  Pick up a knee brace to wear your wrist ace wraps as needed for swelling and pain.  You can also pick up Voltaren gel at the pharmacy, it is over-the-counter and use it on your knees.  Follow-up with orthopedic surgery.

## 2024-04-21 ENCOUNTER — Ambulatory Visit: Admitting: Orthopaedic Surgery

## 2024-07-12 ENCOUNTER — Encounter: Payer: Self-pay | Admitting: Radiology

## 2024-10-07 ENCOUNTER — Other Ambulatory Visit: Payer: Self-pay | Admitting: Physician Assistant

## 2024-10-07 DIAGNOSIS — N6320 Unspecified lump in the left breast, unspecified quadrant: Secondary | ICD-10-CM

## 2024-10-22 ENCOUNTER — Encounter

## 2024-10-22 ENCOUNTER — Other Ambulatory Visit
